# Patient Record
Sex: Female | Born: 1985 | Race: Black or African American | Hispanic: No | Marital: Single | Smoking: Never smoker
Health system: Southern US, Community
[De-identification: ages and names within clinical notes are randomized; demographics above are authoritative.]

## PROBLEM LIST (undated history)

## (undated) DIAGNOSIS — H409 Unspecified glaucoma: Secondary | ICD-10-CM

## (undated) DIAGNOSIS — G35 Multiple sclerosis: Secondary | ICD-10-CM

## (undated) DIAGNOSIS — R561 Post traumatic seizures: Secondary | ICD-10-CM

## (undated) DIAGNOSIS — R519 Headache, unspecified: Secondary | ICD-10-CM

## (undated) DIAGNOSIS — R51 Headache: Secondary | ICD-10-CM

## (undated) DIAGNOSIS — G709 Myoneural disorder, unspecified: Secondary | ICD-10-CM

## (undated) HISTORY — DX: Post traumatic seizures: R56.1

## (undated) HISTORY — PX: EYE SURGERY: SHX253

## (undated) HISTORY — PX: BRAIN SURGERY: SHX531

## (undated) HISTORY — DX: Unspecified glaucoma: H40.9

## (undated) HISTORY — DX: Myoneural disorder, unspecified: G70.9

---

## 2015-07-10 ENCOUNTER — Emergency Department (HOSPITAL_BASED_OUTPATIENT_CLINIC_OR_DEPARTMENT_OTHER): Admit: 2015-07-10 | Discharge: 2015-07-10 | Disposition: A | Discharge status: Home / Self Care | Payer: Self-pay

## 2015-07-10 ENCOUNTER — Encounter (HOSPITAL_COMMUNITY): Payer: Self-pay | Admitting: Emergency Medicine

## 2015-07-10 ENCOUNTER — Emergency Department (HOSPITAL_COMMUNITY)
Admission: EM | Admit: 2015-07-10 | Discharge: 2015-07-10 | Disposition: A | Discharge status: Home / Self Care | Payer: Self-pay | Attending: Emergency Medicine | Admitting: Emergency Medicine

## 2015-07-10 DIAGNOSIS — Y92039 Unspecified place in apartment as the place of occurrence of the external cause: Secondary | ICD-10-CM | POA: Insufficient documentation

## 2015-07-10 DIAGNOSIS — S8992XA Unspecified injury of left lower leg, initial encounter: Secondary | ICD-10-CM

## 2015-07-10 DIAGNOSIS — W19XXXA Unspecified fall, initial encounter: Secondary | ICD-10-CM | POA: Insufficient documentation

## 2015-07-10 DIAGNOSIS — S8012XA Contusion of left lower leg, initial encounter: Secondary | ICD-10-CM | POA: Insufficient documentation

## 2015-07-10 DIAGNOSIS — T148XXA Other injury of unspecified body region, initial encounter: Secondary | ICD-10-CM

## 2015-07-10 DIAGNOSIS — M7989 Other specified soft tissue disorders: Secondary | ICD-10-CM

## 2015-07-10 DIAGNOSIS — Y9301 Activity, walking, marching and hiking: Secondary | ICD-10-CM | POA: Insufficient documentation

## 2015-07-10 DIAGNOSIS — Y999 Unspecified external cause status: Secondary | ICD-10-CM | POA: Insufficient documentation

## 2015-07-10 MED ORDER — NAPROXEN 375 MG PO TABS: 375.0000 mg | ORAL_TABLET | Freq: Two times a day (BID) | ORAL | Status: DC

## 2015-07-10 NOTE — Progress Notes (Signed)
*  Preliminary Results* Left lower extremity venous duplex completed. Left lower extremity is negative for deep vein thrombosis. There is no evidence of left Baker's cyst.  07/10/2015 6:03 PM  Gertie Fey, RVT, RDCS, RDMS

## 2015-07-10 NOTE — ED Notes (Addendum)
Pt from home with complaints of swelling and pain on her left leg following a fall 7/1. Pt states she does not know how she fell, but she was walking in her apartment and fell landing on her left leg. Pt denies any other injuries from the fall. Pt states the pain feels like a burning pain and she rates it as a 5 when walking and a 6 when she is sitting. Pt ambulates without difficulty

## 2015-07-10 NOTE — Discharge Instructions (Signed)
Hematoma  A hematoma is a collection of blood under the skin, in an organ, in a body space, in a joint space, or in other tissue. The blood can clot to form a lump that you can see and feel. The lump is often firm and may sometimes become sore and tender. Most hematomas get better in a few days to weeks. However, some hematomas may be serious and require medical care. Hematomas can range in size from very small to very large.  CAUSES   A hematoma can be caused by a blunt or penetrating injury. It can also be caused by spontaneous leakage from a blood vessel under the skin. Spontaneous leakage from a blood vessel is more likely to occur in older people, especially those taking blood thinners. Sometimes, a hematoma can develop after certain medical procedures.  SIGNS AND SYMPTOMS   · A firm lump on the body.  · Possible pain and tenderness in the area.  · Bruising. Blue, dark blue, purple-red, or yellowish skin may appear at the site of the hematoma if the hematoma is close to the surface of the skin.  For hematomas in deeper tissues or body spaces, the signs and symptoms may be subtle. For example, an intra-abdominal hematoma may cause abdominal pain, weakness, fainting, and shortness of breath. An intracranial hematoma may cause a headache or symptoms such as weakness, trouble speaking, or a change in consciousness.  DIAGNOSIS   A hematoma can usually be diagnosed based on your medical history and a physical exam. Imaging tests may be needed if your health care provider suspects a hematoma in deeper tissues or body spaces, such as the abdomen, head, or chest. These tests may include ultrasonography or a CT scan.   TREATMENT   Hematomas usually go away on their own over time. Rarely does the blood need to be drained out of the body. Large hematomas or those that may affect vital organs will sometimes need surgical drainage or monitoring.  HOME CARE INSTRUCTIONS   · Apply ice to the injured area:      Put ice in a  plastic bag.      Place a towel between your skin and the bag.      Leave the ice on for 20 minutes, 2-3 times a day for the first 1 to 2 days.    · After the first 2 days, switch to using warm compresses on the hematoma.    · Elevate the injured area to help decrease pain and swelling. Wrapping the area with an elastic bandage may also be helpful. Compression helps to reduce swelling and promotes shrinking of the hematoma. Make sure the bandage is not wrapped too tight.    · If your hematoma is on a lower extremity and is painful, crutches may be helpful for a couple days.    · Only take over-the-counter or prescription medicines as directed by your health care provider.  SEEK IMMEDIATE MEDICAL CARE IF:   · You have increasing pain, or your pain is not controlled with medicine.    · You have a fever.    · You have worsening swelling or discoloration.    · Your skin over the hematoma breaks or starts bleeding.    · Your hematoma is in your chest or abdomen and you have weakness, shortness of breath, or a change in consciousness.  · Your hematoma is on your scalp (caused by a fall or injury) and you have a worsening headache or a change in alertness or consciousness.  MAKE SURE YOU:   ·   Understand these instructions.  · Will watch your condition.  · Will get help right away if you are not doing well or get worse.     This information is not intended to replace advice given to you by your health care provider. Make sure you discuss any questions you have with your health care provider.     Document Released: 08/03/2003 Document Revised: 08/21/2012 Document Reviewed: 05/29/2012  Elsevier Interactive Patient Education ©2016 [employer]

## 2015-07-10 NOTE — ED Provider Notes (Signed)
CSN: [id number]     Arrival date & time 07/10/15  1546 History  By signing my name below, I, [facility], attest that this documentation has been prepared under the direction and in the presence of Arthor Captain, PA-C. Electronically Signed: Randell Patient, ED Scribe. 07/10/2015. 6:09 PM.   Chief Complaint  Patient presents with  . Leg Pain    The history is provided by the patient. No language interpreter was used.   HPI Comments: Julia Taylor is a 30 y.o. female who presents to the Emergency Department complaining of constant, gradually worsening, moderate left leg pain onset 8 days ago. She describes the pain as dull while seated and sharp while standing. Pt states that she was ambulating in a parking lot when she fell to the ground followed by gradually worsening, moderate left leg swelling over the past week. She reports associated abrasions and bruising to the left shin. Denies any other symptoms currently.  History reviewed. No pertinent past medical history. Past Surgical History  Procedure Laterality Date  . Brain surgery     No family history on file. Social History  Substance Use Topics  . Smoking status: Never Smoker   . Smokeless tobacco: None  . Alcohol Use: No   OB History    No data available     Review of Systems  Musculoskeletal: Positive for myalgias (left leg).  Skin: Positive for color change (bruising) and wound (abrasions).  All other systems reviewed and are negative.     Allergies  Dilantin and Phenobarbital  Home Medications   Prior to Admission medications   Not on File   BP 130/85 mmHg  Pulse 95  Temp(Src) 99 F (37.2 C) (Oral)  Resp 14  Ht  (1.448 m)  Wt 220 lb (99.791 kg)  BMI 47.59 kg/m2  SpO2 99% Physical Exam  Constitutional: She is oriented to person, place, and time. She appears well-developed and well-nourished. No distress.  HENT:  Head: Normocephalic and atraumatic.  Eyes: Conjunctivae are normal.   Neck: Normal range of motion.  Cardiovascular: Normal rate.   Pulmonary/Chest: Effort normal. No respiratory distress.  Musculoskeletal: Normal range of motion. She exhibits edema and tenderness.  Small abrasion above left knee. Areas of bruising from the left shin. Significant swelling in left leg with tightness of the left calf and pedal edema. TTP. Palpable pulses in the left foot.  Neurological: She is alert and oriented to person, place, and time.  Skin: Skin is warm and dry. Abrasion and bruising noted.  Psychiatric: She has a normal mood and affect. Her behavior is normal.  Nursing note and vitals reviewed.   ED Course  Procedures   DIAGNOSTIC STUDIES: Oxygen Saturation is 99% on RA, normal by my interpretation.    COORDINATION OF CARE: 4:40 PM Will order [place] of lower left extremity to r/o DVT. Discussed treatment plan with pt at bedside and pt agreed to plan.  6:08 PM Returned to discuss results of vascular US with pt. Advised pt to rest, compress and apply ice to the leg. Will discharge pt.   MDM   Final diagnoses:  Left leg injury, initial encounter  Hematoma    Pt here complaining of lower left leg swelling with associated pain. Vascular US of lower left leg negative for signs of DVT. Will treat pt for hematoma of left lower leg. Advised rest, ice, and compression at home. Provided pt with referral to follow-up with orthopedist Dr. Shon Baton as needed. Normal vital signs. Return  precautions discussed.  I personally performed the services described in this documentation, which was scribed in my presence. The recorded information has been reviewed and is accurate.      Arthor Captain, PA-C 07/10/15 2122  Benjiman Core, MD 07/10/15 [phone]

## 2016-04-26 ENCOUNTER — Emergency Department (HOSPITAL_COMMUNITY): Payer: No Typology Code available for payment source

## 2016-04-26 ENCOUNTER — Emergency Department (HOSPITAL_COMMUNITY)
Admission: EM | Admit: 2016-04-26 | Discharge: 2016-04-26 | Disposition: A | Discharge status: Home / Self Care | Payer: No Typology Code available for payment source | Attending: Emergency Medicine | Admitting: Emergency Medicine

## 2016-04-26 ENCOUNTER — Encounter (HOSPITAL_COMMUNITY): Payer: Self-pay | Admitting: Emergency Medicine

## 2016-04-26 DIAGNOSIS — Y92410 Unspecified street and highway as the place of occurrence of the external cause: Secondary | ICD-10-CM | POA: Insufficient documentation

## 2016-04-26 DIAGNOSIS — Y939 Activity, unspecified: Secondary | ICD-10-CM | POA: Insufficient documentation

## 2016-04-26 DIAGNOSIS — Y999 Unspecified external cause status: Secondary | ICD-10-CM | POA: Insufficient documentation

## 2016-04-26 DIAGNOSIS — R0789 Other chest pain: Secondary | ICD-10-CM | POA: Diagnosis not present

## 2016-04-26 DIAGNOSIS — S299XXA Unspecified injury of thorax, initial encounter: Secondary | ICD-10-CM | POA: Diagnosis present

## 2016-04-26 MED ORDER — IBUPROFEN 200 MG PO TABS
600.0000 mg | ORAL_TABLET | Freq: Once | ORAL | Status: AC
  Administered 2016-04-26: 600 mg via ORAL
  Filled 2016-04-26: qty 1

## 2016-04-26 MED ORDER — CYCLOBENZAPRINE HCL 10 MG PO TABS: 10.0000 mg | ORAL_TABLET | Freq: Two times a day (BID) | ORAL | 0 refills | Status: DC | PRN

## 2016-04-26 NOTE — Discharge Instructions (Signed)
Please read attached information. If you experience any new or worsening signs or symptoms please return to the emergency room for evaluation. Please follow-up with your primary care provider or specialist as discussed. Please use medication prescribed only as directed and discontinue taking if you have any concerning signs or symptoms.   °
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
## 2017-02-13 ENCOUNTER — Ambulatory Visit: Payer: Self-pay | Admitting: Family Medicine

## 2017-02-15 ENCOUNTER — Encounter (HOSPITAL_COMMUNITY): Payer: Self-pay | Admitting: *Deleted

## 2017-02-15 ENCOUNTER — Inpatient Hospital Stay (HOSPITAL_COMMUNITY)
Admission: AD | Admit: 2017-02-15 | Discharge: 2017-02-15 | Disposition: A | Discharge status: Home / Self Care | Payer: Managed Care, Other (non HMO) | Source: Ambulatory Visit | Attending: Obstetrics and Gynecology | Admitting: Obstetrics and Gynecology

## 2017-02-15 ENCOUNTER — Other Ambulatory Visit: Payer: Self-pay

## 2017-02-15 DIAGNOSIS — R110 Nausea: Secondary | ICD-10-CM

## 2017-02-15 DIAGNOSIS — Z3202 Encounter for pregnancy test, result negative: Secondary | ICD-10-CM | POA: Diagnosis not present

## 2017-02-15 DIAGNOSIS — Z800 Family history of malignant neoplasm of digestive organs: Secondary | ICD-10-CM | POA: Diagnosis not present

## 2017-02-15 DIAGNOSIS — Z888 Allergy status to other drugs, medicaments and biological substances status: Secondary | ICD-10-CM | POA: Diagnosis not present

## 2017-02-15 DIAGNOSIS — R5383 Other fatigue: Secondary | ICD-10-CM | POA: Diagnosis not present

## 2017-02-15 HISTORY — DX: Headache: R51

## 2017-02-15 HISTORY — DX: Headache, unspecified: R51.9

## 2017-02-15 LAB — HCG, QUANTITATIVE, PREGNANCY: hCG, Beta Chain, Quant, S: 1 m[IU]/mL (ref <5)

## 2017-02-15 LAB — POCT PREGNANCY, URINE: Preg Test, Ur: NEGATIVE

## 2017-02-15 LAB — ABO/RH: ABO/RH(D): B POS

## 2017-02-15 NOTE — MAU Note (Signed)
Went to Urgent Care last wk, was told by blood work that she was preg. (3-4wks), went back on Tues to same facility and was told by blood work she is not preg.  Has had NO bleeding or pain. No sign of miscarriage. Has had some minor cramping, though none today.  Has not missed a period, was having nausea, cramping and sleeping all the time.
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
## 2017-02-15 NOTE — MAU Note (Signed)
Urine in lab with culture tube

## 2017-02-15 NOTE — Discharge Instructions (Signed)
Drink at least 8 8-oz glasses of water every day. Take Tylenol 325 mg 2 tablets by mouth every 4 hours if needed for pain. Continue the prenatal vitamin daily. Establish with a provider who can follow you through a pregnancy. Keep a diary listing the first day of your periods.

## 2017-03-20 ENCOUNTER — Other Ambulatory Visit (HOSPITAL_COMMUNITY)
Admission: RE | Admit: 2017-03-20 | Discharge: 2017-03-20 | Disposition: A | Discharge status: Home / Self Care | Payer: Managed Care, Other (non HMO) | Source: Ambulatory Visit | Attending: Family Medicine | Admitting: Family Medicine

## 2017-03-20 ENCOUNTER — Other Ambulatory Visit: Payer: Self-pay

## 2017-03-20 ENCOUNTER — Ambulatory Visit: Payer: Managed Care, Other (non HMO) | Admitting: Family Medicine

## 2017-03-20 ENCOUNTER — Ambulatory Visit (INDEPENDENT_AMBULATORY_CARE_PROVIDER_SITE_OTHER): Payer: Managed Care, Other (non HMO) | Admitting: Family Medicine

## 2017-03-20 ENCOUNTER — Encounter: Payer: Self-pay | Admitting: Family Medicine

## 2017-03-20 VITALS — BP 126/74 | HR 81 | Temp 98.0°F | Ht <= 58 in | Wt 225.0 lb

## 2017-03-20 DIAGNOSIS — Z6841 Body Mass Index (BMI) 40.0 and over, adult: Secondary | ICD-10-CM | POA: Insufficient documentation

## 2017-03-20 DIAGNOSIS — Z113 Encounter for screening for infections with a predominantly sexual mode of transmission: Secondary | ICD-10-CM | POA: Diagnosis not present

## 2017-03-20 DIAGNOSIS — Z8742 Personal history of other diseases of the female genital tract: Secondary | ICD-10-CM | POA: Diagnosis not present

## 2017-03-20 DIAGNOSIS — E559 Vitamin D deficiency, unspecified: Secondary | ICD-10-CM | POA: Insufficient documentation

## 2017-03-20 DIAGNOSIS — Z114 Encounter for screening for human immunodeficiency virus [HIV]: Secondary | ICD-10-CM | POA: Diagnosis not present

## 2017-03-20 DIAGNOSIS — Z0000 Encounter for general adult medical examination without abnormal findings: Secondary | ICD-10-CM | POA: Insufficient documentation

## 2017-03-20 LAB — POCT WET PREP (WET MOUNT)
Clue Cells Wet Prep Whiff POC: NEGATIVE
TRICHOMONAS WET PREP HPF POC: ABSENT

## 2017-03-20 NOTE — Patient Instructions (Addendum)
It was good to see you today!  We are checking some labs today. If results require attention, either myself or my nurse will get in touch with you. If everything is normal, you will get a letter in the mail or a message in My Chart. Please give [place] a call if you do not hear from [place] after 2 weeks.  Please check-out at the front desk before leaving the clinic. Make an appointment for your annual wellness exam whenever you like.   Please bring all of your medications with you to each visit.   Sign up for My Chart to have easy access to your labs results, and communication with your primary care physician.  Feel free to call with any questions or concerns at any time, at [phone].   Take care,  Dr. Leland Her, DO [facility]
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
## 2017-03-20 NOTE — Assessment & Plan Note (Signed)
Given recent treatment for trich but with untreated partner, re-testing today. Wet prep negative. GC/CT and HIV pending.

## 2017-03-20 NOTE — Assessment & Plan Note (Signed)
Patient finishing up course of vitamin D replacement so recheck vit D levels.

## 2017-03-20 NOTE — Assessment & Plan Note (Signed)
Patient missed period in February after a positive home pregnancy test in January but a negative in-office pregnancy test at [facility]. Question if patient had a very early miscarriage or is was a false positive. She does endorse h/o heavy periods so will get baseline CBC.

## 2017-03-20 NOTE — Assessment & Plan Note (Addendum)
Check lipid panel and BMP given patient is in morbid obesity range. Discussed that at future visit will need to talk about lifestyle modifications.

## 2017-03-21 LAB — LIPID PANEL
CHOL/HDL RATIO: 4.6 ratio — AB (ref 0.0–4.4)
Cholesterol, Total: 223 mg/dL — ABNORMAL HIGH (ref 100–199)
HDL: 48 mg/dL (ref >39)
LDL Calculated: 154 mg/dL — ABNORMAL HIGH (ref 0–99)
TRIGLYCERIDES: 106 mg/dL (ref 0–149)
VLDL Cholesterol Cal: 21 mg/dL (ref 5–40)

## 2017-03-21 LAB — BASIC METABOLIC PANEL
BUN / CREAT RATIO: 17 (ref 9–23)
BUN: 9 mg/dL (ref 6–20)
CO2: 19 mmol/L — AB (ref 20–29)
Calcium: 10.6 mg/dL — ABNORMAL HIGH (ref 8.7–10.2)
Chloride: 104 mmol/L (ref 96–106)
Creatinine, Ser: 0.53 mg/dL — ABNORMAL LOW (ref 0.57–1.00)
GFR calc Af Amer: 148 mL/min/{1.73_m2} (ref >59)
GFR, EST NON AFRICAN AMERICAN: 129 mL/min/{1.73_m2} (ref >59)
Glucose: 74 mg/dL (ref 65–99)
POTASSIUM: 4.4 mmol/L (ref 3.5–5.2)
SODIUM: 139 mmol/L (ref 134–144)

## 2017-03-21 LAB — CBC
HEMATOCRIT: 42.8 % (ref 34.0–46.6)
Hemoglobin: 14.3 g/dL (ref 11.1–15.9)
MCH: 29.7 pg (ref 26.6–33.0)
MCHC: 33.4 g/dL (ref 31.5–35.7)
MCV: 89 fL (ref 79–97)
Platelets: 295 10*3/uL (ref 150–379)
RBC: 4.81 x10E6/uL (ref 3.77–5.28)
RDW: 14 % (ref 12.3–15.4)
WBC: 5.8 10*3/uL (ref 3.4–10.8)

## 2017-03-21 LAB — CERVICOVAGINAL ANCILLARY ONLY
CHLAMYDIA, DNA PROBE: NEGATIVE
NEISSERIA GONORRHEA: NEGATIVE

## 2017-03-21 LAB — VITAMIN D 25 HYDROXY (VIT D DEFICIENCY, FRACTURES): VIT D 25 HYDROXY: 22.5 ng/mL — AB (ref 30.0–100.0)

## 2017-03-21 LAB — HIV ANTIBODY (ROUTINE TESTING W REFLEX): HIV SCREEN 4TH GENERATION: NONREACTIVE

## 2017-03-23 ENCOUNTER — Telehealth: Payer: Self-pay

## 2017-03-23 ENCOUNTER — Encounter: Payer: Self-pay | Admitting: Family Medicine

## 2017-03-23 NOTE — Telephone Encounter (Signed)
Called patient and discussed getting OTC vit D 600-800 IU daily. All questions were answered.

## 2017-03-23 NOTE — Telephone Encounter (Signed)
Patient would like a call back to discuss her lab results with Dr Artist Pais. Ples Specter, RN [facility] RN)

## 2017-06-03 ENCOUNTER — Emergency Department (HOSPITAL_COMMUNITY)
Admission: EM | Admit: 2017-06-03 | Discharge: 2017-06-03 | Disposition: A | Discharge status: Home / Self Care | Payer: Managed Care, Other (non HMO) | Attending: Emergency Medicine | Admitting: Emergency Medicine

## 2017-06-03 ENCOUNTER — Emergency Department (HOSPITAL_COMMUNITY): Payer: Managed Care, Other (non HMO)

## 2017-06-03 ENCOUNTER — Other Ambulatory Visit: Payer: Self-pay

## 2017-06-03 DIAGNOSIS — Z79899 Other long term (current) drug therapy: Secondary | ICD-10-CM | POA: Diagnosis not present

## 2017-06-03 DIAGNOSIS — R1032 Left lower quadrant pain: Secondary | ICD-10-CM | POA: Insufficient documentation

## 2017-06-03 DIAGNOSIS — R109 Unspecified abdominal pain: Secondary | ICD-10-CM

## 2017-06-03 DIAGNOSIS — N926 Irregular menstruation, unspecified: Secondary | ICD-10-CM | POA: Diagnosis not present

## 2017-06-03 LAB — CBC WITH DIFFERENTIAL/PLATELET
BASOS ABS: 0 10*3/uL (ref 0.0–0.1)
BASOS PCT: 0 %
Eosinophils Absolute: 0.1 10*3/uL (ref 0.0–0.7)
Eosinophils Relative: 2 %
HEMATOCRIT: 40.9 % (ref 36.0–46.0)
HEMOGLOBIN: 14.1 g/dL (ref 12.0–15.0)
LYMPHS PCT: 35 %
Lymphs Abs: 2.4 10*3/uL (ref 0.7–4.0)
MCH: 29.9 pg (ref 26.0–34.0)
MCHC: 34.5 g/dL (ref 30.0–36.0)
MCV: 86.8 fL (ref 78.0–100.0)
Monocytes Absolute: 0.5 10*3/uL (ref 0.1–1.0)
Monocytes Relative: 7 %
NEUTROS ABS: 3.9 10*3/uL (ref 1.7–7.7)
NEUTROS PCT: 56 %
Platelets: 296 10*3/uL (ref 150–400)
RBC: 4.71 MIL/uL (ref 3.87–5.11)
RDW: 12.6 % (ref 11.5–15.5)
WBC: 6.9 10*3/uL (ref 4.0–10.5)

## 2017-06-03 LAB — URINALYSIS, ROUTINE W REFLEX MICROSCOPIC
Bilirubin Urine: NEGATIVE
GLUCOSE, UA: NEGATIVE mg/dL
HGB URINE DIPSTICK: NEGATIVE
KETONES UR: 5 mg/dL — AB
LEUKOCYTES UA: NEGATIVE
Nitrite: NEGATIVE
PH: 5 (ref 5.0–8.0)
Protein, ur: NEGATIVE mg/dL
Specific Gravity, Urine: 1.03 (ref 1.005–1.030)

## 2017-06-03 LAB — COMPREHENSIVE METABOLIC PANEL
ALBUMIN: 3.9 g/dL (ref 3.5–5.0)
ALK PHOS: 55 U/L (ref 38–126)
ALT: 24 U/L (ref 14–54)
AST: 18 U/L (ref 15–41)
Anion gap: 8 (ref 5–15)
BUN: 9 mg/dL (ref 6–20)
CO2: 23 mmol/L (ref 22–32)
CREATININE: 0.71 mg/dL (ref 0.44–1.00)
Calcium: 10.6 mg/dL — ABNORMAL HIGH (ref 8.9–10.3)
Chloride: 110 mmol/L (ref 101–111)
GFR calc Af Amer: >60 mL/min (ref >60)
GFR calc non Af Amer: >60 mL/min (ref >60)
GLUCOSE: 95 mg/dL (ref 65–99)
POTASSIUM: 3.6 mmol/L (ref 3.5–5.1)
Sodium: 141 mmol/L (ref 135–145)
Total Bilirubin: 0.5 mg/dL (ref 0.3–1.2)
Total Protein: 7.4 g/dL (ref 6.5–8.1)

## 2017-06-03 LAB — POC URINE PREG, ED: Preg Test, Ur: NEGATIVE

## 2017-06-03 LAB — WET PREP, GENITAL
CLUE CELLS WET PREP: NONE SEEN
SPERM: NONE SEEN
TRICH WET PREP: NONE SEEN
Yeast Wet Prep HPF POC: NONE SEEN

## 2017-06-03 LAB — LIPASE, BLOOD: Lipase: 34 U/L (ref 11–51)

## 2017-06-03 MED ORDER — IBUPROFEN 800 MG PO TABS
800.0000 mg | ORAL_TABLET | Freq: Once | ORAL | Status: AC
  Administered 2017-06-03: 800 mg via ORAL
  Filled 2017-06-03: qty 1

## 2017-06-03 MED ORDER — SODIUM CHLORIDE 0.9 % IV BOLUS
1000.0000 mL | Freq: Once | INTRAVENOUS | Status: AC
  Administered 2017-06-03: 1000 mL via INTRAVENOUS

## 2017-06-03 NOTE — ED Notes (Signed)
Urine culture sent down to the lab as well. 
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
## 2017-06-03 NOTE — ED Triage Notes (Signed)
Pt complains of left flank pain. Pt was told she had protein in her urine last week and is concerned about her kidneys. Pt denies urinary symptoms.

## 2017-06-03 NOTE — Discharge Instructions (Addendum)
Your work up today has been fairly unremarkable, there was no evidence of kidney stone, and your pelvic exam was unremarkable. Your flank and abdominal pain could be from a variety of different nonemergent causes, such as constipation or gas pain, pulled muscle, etc. Alternate between tylenol and ibuprofen as needed for pain. You may consider using heat to the area to help with pain as well. Stay well hydrated and get plenty of rest.  You have been tested for gonorrhea, chlamydia, HIV, and Syphilis, and the hospital will call you if the lab is positive. DO NOT ENGAGE IN SEXUAL ACTIVITY UNTIL YOU FIND OUT ABOUT YOUR RESULTS AND HAVE PARTNERS TESTED AND TREATED. ALL PARTNERS MUST BE TESTED AND TREATED FOR STD'S. ALWAYS USE CONDOMS WHEN ENGAGING IN INTERCOURSE. Follow up with [facility] Department STD clinic for future STD concerns or screenings.  Follow up with your regular doctor in 1 week for recheck of symptoms. Return to the ER for any changes or worsening symptoms.     Abdominal (belly) pain can be caused by many things. Your caregiver performed an examination and possibly ordered blood/urine tests and imaging (CT scan, x-rays, ultrasound). Many cases can be observed and treated at home after initial evaluation in the emergency department. Even though you are being discharged home, abdominal pain can be unpredictable. Therefore, you need a repeated exam if your pain does not resolve, returns, or worsens. Most patients with abdominal pain don't have to be admitted to the hospital or have surgery, but serious problems like appendicitis and gallbladder attacks can start out as nonspecific pain. Many abdominal conditions cannot be diagnosed in one visit, so follow-up evaluations are very important. SEEK IMMEDIATE MEDICAL ATTENTION IF YOU DEVELOP ANY OF THE FOLLOWING SYMPTOMS: The pain does not go away or becomes severe.  A temperature above 101 develops.  Repeated vomiting occurs (multiple  episodes).  The pain becomes localized to portions of the abdomen. The right side could possibly be appendicitis. In an adult, the left lower portion of the abdomen could be colitis or diverticulitis.  Blood is being passed in stools or vomit (bright red or black tarry stools).  Return also if you develop chest pain, difficulty breathing, dizziness or fainting, or become confused, poorly responsive, or inconsolable (young children). The constipation stays for more than 4 days.  There is belly (abdominal) or rectal pain.  You do not seem to be getting better.

## 2017-06-04 ENCOUNTER — Ambulatory Visit: Payer: Managed Care, Other (non HMO) | Admitting: Internal Medicine

## 2017-06-05 LAB — HIV ANTIBODY (ROUTINE TESTING W REFLEX): HIV Screen 4th Generation wRfx: NONREACTIVE

## 2017-06-05 LAB — RPR: RPR: NONREACTIVE

## 2017-12-04 DIAGNOSIS — Z719 Counseling, unspecified: Secondary | ICD-10-CM | POA: Diagnosis not present

## 2017-12-05 ENCOUNTER — Encounter: Payer: Self-pay | Admitting: Family Medicine

## 2017-12-05 ENCOUNTER — Other Ambulatory Visit (HOSPITAL_COMMUNITY)
Admission: RE | Admit: 2017-12-05 | Discharge: 2017-12-05 | Disposition: A | Discharge status: Home / Self Care | Payer: 59 | Source: Ambulatory Visit | Attending: Family Medicine | Admitting: Family Medicine

## 2017-12-05 ENCOUNTER — Ambulatory Visit (INDEPENDENT_AMBULATORY_CARE_PROVIDER_SITE_OTHER): Payer: 59 | Admitting: Family Medicine

## 2017-12-05 ENCOUNTER — Other Ambulatory Visit: Payer: Self-pay

## 2017-12-05 VITALS — BP 104/70 | HR 86 | Temp 98.3°F | Ht <= 58 in | Wt 218.2 lb

## 2017-12-05 DIAGNOSIS — Z124 Encounter for screening for malignant neoplasm of cervix: Secondary | ICD-10-CM | POA: Insufficient documentation

## 2017-12-05 DIAGNOSIS — E559 Vitamin D deficiency, unspecified: Secondary | ICD-10-CM

## 2017-12-05 DIAGNOSIS — Z0001 Encounter for general adult medical examination with abnormal findings: Secondary | ICD-10-CM

## 2017-12-05 DIAGNOSIS — Z23 Encounter for immunization: Secondary | ICD-10-CM | POA: Diagnosis not present

## 2017-12-05 DIAGNOSIS — Z719 Counseling, unspecified: Secondary | ICD-10-CM | POA: Diagnosis not present

## 2017-12-05 DIAGNOSIS — Z6841 Body Mass Index (BMI) 40.0 and over, adult: Secondary | ICD-10-CM

## 2017-12-05 NOTE — Patient Instructions (Signed)
Things to do to keep yourself healthy  - Exercise at least 30-45 minutes a day, 3-4 days a week.  - Eat a low-fat diet with lots of fruits and vegetables, up to 7-9 servings per day.  - Seatbelts can save your life. Wear them always.  - Smoke detectors on every level of your home, check batteries every year.  - Eye Doctor - have an eye exam every 1-2 years  - Safe sex - if you may be exposed to STDs, use a condom.  - Alcohol -  If you drink, do it moderately, less than 2 drinks per day.  - Health Care Power of Attorney. Choose someone to speak for you if you are not able.  - Depression is common in our stressful world.If you're feeling down or losing interest in things you normally enjoy, please come in for a visit.  - Violence - If anyone is threatening or hurting you, please call immediately.  It was good to see you today!  Please check-out at the front desk before leaving the clinic. Make an appointment in  1 year.  We are checking some labs today. If results require attention, either myself or my nurse will get in touch with you. If everything is normal, you will get a letter in the mail or a message in My Chart. Please give [place] a call if you do not hear from [place] after 2 weeks.   Feel free to call with any questions or concerns at any time, at [phone].   Take care,  Dr. Leland Her, DO [facility]

## 2017-12-05 NOTE — Assessment & Plan Note (Signed)
Pap collected today.  Flu shot given today. Otherwise UTD.

## 2017-12-05 NOTE — Assessment & Plan Note (Signed)
Recheck vitamin D today as patient has self discontinued vitamin D supplementation

## 2017-12-05 NOTE — Assessment & Plan Note (Signed)
Doing well with some intentional weight loss after lifestyle modifications. Encouraged patient efforts and counseled on healthy diet.
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
## 2017-12-06 ENCOUNTER — Encounter: Payer: Self-pay | Admitting: Family Medicine

## 2017-12-06 LAB — VITAMIN D 25 HYDROXY (VIT D DEFICIENCY, FRACTURES): VIT D 25 HYDROXY: 29.9 ng/mL — AB (ref 30.0–100.0)

## 2017-12-07 LAB — CYTOLOGY - PAP
DIAGNOSIS: UNDETERMINED — AB
HPV (WINDOPATH): NOT DETECTED

## 2017-12-10 ENCOUNTER — Telehealth: Payer: Self-pay | Admitting: Family Medicine

## 2017-12-10 DIAGNOSIS — A5901 Trichomonal vulvovaginitis: Secondary | ICD-10-CM

## 2017-12-10 MED ORDER — METRONIDAZOLE 500 MG PO TABS: 2000.0000 mg | ORAL_TABLET | Freq: Once | ORAL | 0 refills | Status: AC

## 2017-12-10 NOTE — Telephone Encounter (Signed)
Discussed treatment for patient and all sexual partners for trich. Next pap in 3 years because HPV neg. Patient voiced good understanding. Requested to see me for numbness in her fingers which a new complaint, no cyanosis or weakness. Does have lots of repetitive motion at work. Appt made with me on 12/14/17

## 2017-12-11 ENCOUNTER — Emergency Department (HOSPITAL_COMMUNITY)
Admission: EM | Admit: 2017-12-11 | Discharge: 2017-12-11 | Disposition: A | Discharge status: Home / Self Care | Payer: 59 | Attending: Emergency Medicine | Admitting: Emergency Medicine

## 2017-12-11 ENCOUNTER — Encounter (HOSPITAL_COMMUNITY): Payer: Self-pay | Admitting: *Deleted

## 2017-12-11 DIAGNOSIS — Z79899 Other long term (current) drug therapy: Secondary | ICD-10-CM | POA: Diagnosis not present

## 2017-12-11 DIAGNOSIS — G5603 Carpal tunnel syndrome, bilateral upper limbs: Secondary | ICD-10-CM | POA: Insufficient documentation

## 2017-12-11 DIAGNOSIS — R200 Anesthesia of skin: Secondary | ICD-10-CM | POA: Diagnosis not present

## 2017-12-11 LAB — CBG MONITORING, ED: GLUCOSE-CAPILLARY: 71 mg/dL (ref 70–99)

## 2017-12-11 MED ORDER — MELOXICAM 15 MG PO TABS: 15.0000 mg | ORAL_TABLET | Freq: Every day | ORAL | 0 refills | Status: DC

## 2017-12-11 NOTE — ED Triage Notes (Signed)
Pt complains of bilateral finger numbness and tingling x 4 days. Pt denies other symptoms. 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

## 2017-12-11 NOTE — ED Notes (Signed)
CBG 71.  

## 2017-12-11 NOTE — Discharge Instructions (Addendum)
Your blood sugar is normal. Contact a health care provider if: You have new symptoms. Your pain is not controlled with medicines. Your symptoms get worse.
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
## 2017-12-14 ENCOUNTER — Ambulatory Visit: Payer: 59 | Admitting: Family Medicine

## 2018-02-14 ENCOUNTER — Encounter (HOSPITAL_COMMUNITY): Payer: Self-pay | Admitting: Emergency Medicine

## 2018-02-14 DIAGNOSIS — Z79899 Other long term (current) drug therapy: Secondary | ICD-10-CM | POA: Insufficient documentation

## 2018-02-14 DIAGNOSIS — G5603 Carpal tunnel syndrome, bilateral upper limbs: Secondary | ICD-10-CM | POA: Insufficient documentation

## 2018-02-14 DIAGNOSIS — R202 Paresthesia of skin: Secondary | ICD-10-CM | POA: Diagnosis not present

## 2018-02-14 LAB — COMPREHENSIVE METABOLIC PANEL
ALBUMIN: 4.1 g/dL (ref 3.5–5.0)
ALT: 23 U/L (ref 0–44)
AST: 20 U/L (ref 15–41)
Alkaline Phosphatase: 57 U/L (ref 38–126)
Anion gap: 7 (ref 5–15)
BILIRUBIN TOTAL: 0.6 mg/dL (ref 0.3–1.2)
BUN: 11 mg/dL (ref 6–20)
CALCIUM: 10.2 mg/dL (ref 8.9–10.3)
CO2: 25 mmol/L (ref 22–32)
Chloride: 104 mmol/L (ref 98–111)
Creatinine, Ser: 0.68 mg/dL (ref 0.44–1.00)
GFR calc Af Amer: >60 mL/min (ref >60)
GFR calc non Af Amer: >60 mL/min (ref >60)
GLUCOSE: 84 mg/dL (ref 70–99)
POTASSIUM: 4 mmol/L (ref 3.5–5.1)
Sodium: 136 mmol/L (ref 135–145)
TOTAL PROTEIN: 8 g/dL (ref 6.5–8.1)

## 2018-02-14 LAB — CBC WITH DIFFERENTIAL/PLATELET
ABS IMMATURE GRANULOCYTES: 0.04 10*3/uL (ref 0.00–0.07)
Basophils Absolute: 0 10*3/uL (ref 0.0–0.1)
Basophils Relative: 0 %
EOS ABS: 0.2 10*3/uL (ref 0.0–0.5)
Eosinophils Relative: 2 %
HEMATOCRIT: 45.4 % (ref 36.0–46.0)
Hemoglobin: 15.2 g/dL — ABNORMAL HIGH (ref 12.0–15.0)
Immature Granulocytes: 0 %
LYMPHS ABS: 1.9 10*3/uL (ref 0.7–4.0)
Lymphocytes Relative: 22 %
MCH: 30 pg (ref 26.0–34.0)
MCHC: 33.5 g/dL (ref 30.0–36.0)
MCV: 89.5 fL (ref 80.0–100.0)
MONO ABS: 0.4 10*3/uL (ref 0.1–1.0)
MONOS PCT: 5 %
NEUTROS ABS: 6.4 10*3/uL (ref 1.7–7.7)
Neutrophils Relative %: 71 %
Platelets: 300 10*3/uL (ref 150–400)
RBC: 5.07 MIL/uL (ref 3.87–5.11)
RDW: 12.2 % (ref 11.5–15.5)
WBC: 9 10*3/uL (ref 4.0–10.5)
nRBC: 0 % (ref 0.0–0.2)

## 2018-02-14 LAB — I-STAT BETA HCG BLOOD, ED (MC, WL, AP ONLY): I-stat hCG, quantitative: <5 m[IU]/mL (ref <5)

## 2018-02-14 LAB — LACTIC ACID, PLASMA: Lactic Acid, Venous: 1.3 mmol/L (ref 0.5–1.9)

## 2018-02-14 NOTE — ED Triage Notes (Signed)
Patient here from home with complaints of bilateral hand pain radiating up arms into head. Described as tingling and pain.

## 2018-02-15 ENCOUNTER — Emergency Department (HOSPITAL_COMMUNITY)
Admission: EM | Admit: 2018-02-15 | Discharge: 2018-02-15 | Disposition: A | Discharge status: Home / Self Care | Payer: 59 | Attending: Emergency Medicine | Admitting: Emergency Medicine

## 2018-02-15 ENCOUNTER — Other Ambulatory Visit: Payer: Self-pay

## 2018-02-15 ENCOUNTER — Encounter: Payer: Self-pay | Admitting: Neurology

## 2018-02-15 DIAGNOSIS — G5603 Carpal tunnel syndrome, bilateral upper limbs: Secondary | ICD-10-CM

## 2018-02-15 DIAGNOSIS — M542 Cervicalgia: Secondary | ICD-10-CM | POA: Diagnosis not present

## 2018-02-15 DIAGNOSIS — R202 Paresthesia of skin: Secondary | ICD-10-CM

## 2018-02-15 MED ORDER — NAPROXEN 500 MG PO TABS: 500.0000 mg | ORAL_TABLET | Freq: Two times a day (BID) | ORAL | 0 refills | Status: DC

## 2018-02-15 NOTE — ED Notes (Signed)
Bed: YY51 Expected date:  Expected time:  Means of arrival:  Comments: Joseph Art
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
## 2018-02-16 ENCOUNTER — Encounter: Payer: Self-pay | Admitting: Family Medicine

## 2018-02-18 ENCOUNTER — Encounter: Payer: Self-pay | Admitting: Neurology

## 2018-02-18 ENCOUNTER — Ambulatory Visit: Payer: 59 | Admitting: Neurology

## 2018-02-18 VITALS — BP 136/74 | HR 76 | Ht 60.0 in | Wt 215.0 lb

## 2018-02-18 DIAGNOSIS — Z87828 Personal history of other (healed) physical injury and trauma: Secondary | ICD-10-CM | POA: Diagnosis not present

## 2018-02-18 DIAGNOSIS — R202 Paresthesia of skin: Secondary | ICD-10-CM | POA: Insufficient documentation

## 2018-02-18 MED ORDER — LEVETIRACETAM 500 MG PO TABS: 500.0000 mg | ORAL_TABLET | Freq: Two times a day (BID) | ORAL | 11 refills | Status: DC
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
## 2018-02-20 ENCOUNTER — Telehealth: Payer: Self-pay | Admitting: Neurology

## 2018-02-20 NOTE — Telephone Encounter (Signed)
MR Brain w/wo contrast Dr. Terrace Arabia [facility] Auth: NPR via [employer] website. Patient is scheduled at [facility] for 02/26/18.

## 2018-02-22 ENCOUNTER — Encounter: Payer: 59 | Admitting: Neurology

## 2018-02-26 ENCOUNTER — Ambulatory Visit: Payer: 59

## 2018-02-26 DIAGNOSIS — R202 Paresthesia of skin: Secondary | ICD-10-CM

## 2018-02-26 DIAGNOSIS — Z87828 Personal history of other (healed) physical injury and trauma: Secondary | ICD-10-CM

## 2018-02-26 MED ORDER — GADOBENATE DIMEGLUMINE 529 MG/ML IV SOLN
20.0000 mL | Freq: Once | INTRAVENOUS | Status: AC | PRN
  Administered 2018-02-26: 20 mL via INTRAVENOUS

## 2018-02-26 MED ORDER — GADOBENATE DIMEGLUMINE 529 MG/ML IV SOLN: 20.0000 mL | Freq: Once | INTRAVENOUS | Status: DC | PRN

## 2018-02-28 ENCOUNTER — Telehealth: Payer: Self-pay | Admitting: Neurology

## 2018-02-28 NOTE — Telephone Encounter (Signed)
please call patient, MRI of brain showed mild supratentorium nonspecific signal changes, no acute abnormalities.   IMPRESSION:   MRI brain (with and without) demonstrating: - Few round and ovoid periventricular, subcortical and right cerebellar T2 hyperintensities. Considerations include demyelinating, autoimmune, inflammatory, post-infectious, microvascular ischemic or migraine associated etiologies.  - No acute changes. No abnormal enhancing lesions.

## 2018-02-28 NOTE — Telephone Encounter (Signed)
Spoke to patient - she is aware of her MRI results and will keep her pending appt for further review.

## 2018-02-28 NOTE — Telephone Encounter (Signed)
Left message requesting a return call to discuss MRI results.

## 2018-03-06 ENCOUNTER — Telehealth: Payer: Self-pay | Admitting: Neurology

## 2018-03-06 DIAGNOSIS — R9089 Other abnormal findings on diagnostic imaging of central nervous system: Secondary | ICD-10-CM

## 2018-03-06 MED ORDER — LEVETIRACETAM 500 MG PO TABS: ORAL_TABLET | ORAL | 0 refills | Status: DC

## 2018-03-06 NOTE — Telephone Encounter (Signed)
  To: [facility] Clinical Pool  Subject: Non-Urgent Medical Question             Okay thank you. Since Feb 17 I have had three attacks one feb 22 in my right arm, feb 26 and 27 in my left arm I understand the MRI was good and we have these other test but I can still feel the pain to the point it's not locking up (which is good) now but I still can feel the electricity feeling going through my hands and face (a very uncomfortable feeling) I don't know if my next three test can be moved up or not. But is there anything your able to advice that I can do right now to easy the discomfort   Please check with patient to see if starting keppra 500mg  bid was helpful or not.  We have room to increase keppra to higher dose if it does help

## 2018-03-06 NOTE — Telephone Encounter (Addendum)
Returned call to the patient.  She is not having the "locking up" of her arm any longer but is still having the electrical sensations.  She feels this improvement is related to Keppra 500mg , one tablet BID.  Per vo by Dr. Terrace Arabia, she should increase her Keppra to 500mg , 1.5 tablets BID to see if this is helpful for her remaining symptoms.  She is in agreement with his plan. She will keep her pending NCV/EMG on 03/27/2018.

## 2018-03-06 NOTE — Addendum Note (Signed)
Addended by: Lindell Spar C on: 03/06/2018 04:09 PM   Modules accepted: Orders

## 2018-03-06 NOTE — Telephone Encounter (Signed)
Pt is asking to speak with RN Marcelino Duster because she just had a seizure and she does not feel her current medication is helping.  Please call

## 2018-03-07 MED ORDER — GABAPENTIN 300 MG PO CAPS: 300.0000 mg | ORAL_CAPSULE | Freq: Three times a day (TID) | ORAL | 3 refills | Status: DC

## 2018-03-07 NOTE — Telephone Encounter (Signed)
I have talked with patient, she denies improvement with Keppra 500 mg twice daily, she now has more frequent spells, once a week, she complains of muscle tightness from breast and up radiating to bilateral upper extremity, muscle contraction relaxing for 1 hour, there was no persistent weakness, or numbness, no gait abnormality  I also reviewed MRI of the brain without contrast, there was some supratentorium, right cerebellum oval-shaped lesion, which raised the possibility of demyelinating,  Complete evaluation with MRI of cervical, thoracic spine  Stop Keppra, starting  Gabapentin 300mg  tid.

## 2018-03-07 NOTE — Addendum Note (Signed)
Addended by: Levert Feinstein on: 03/07/2018 09:51 AM   Modules accepted: Orders

## 2018-03-11 ENCOUNTER — Telehealth: Payer: Self-pay | Admitting: Neurology

## 2018-03-11 NOTE — Telephone Encounter (Signed)
Patient returned my call she is scheduled for 03/13/18 at [facility].

## 2018-03-11 NOTE — Telephone Encounter (Signed)
lvm for pt to call back about scheduling MRI  [employer] auth: NPR via [employer]

## 2018-03-13 ENCOUNTER — Other Ambulatory Visit: Payer: Self-pay

## 2018-03-13 ENCOUNTER — Ambulatory Visit (INDEPENDENT_AMBULATORY_CARE_PROVIDER_SITE_OTHER): Payer: 59

## 2018-03-13 DIAGNOSIS — R9089 Other abnormal findings on diagnostic imaging of central nervous system: Secondary | ICD-10-CM | POA: Diagnosis not present

## 2018-03-13 MED ORDER — GADOBENATE DIMEGLUMINE 529 MG/ML IV SOLN
20.0000 mL | Freq: Once | INTRAVENOUS | Status: AC | PRN
  Administered 2018-03-13: 20 mL via INTRAVENOUS

## 2018-03-15 ENCOUNTER — Telehealth: Payer: Self-pay | Admitting: *Deleted

## 2018-03-15 DIAGNOSIS — R930 Abnormal findings on diagnostic imaging of skull and head, not elsewhere classified: Secondary | ICD-10-CM

## 2018-03-15 NOTE — Telephone Encounter (Signed)
Spoke with pt. and advised MRI T-spine is nml.  She verbalized understanding of same/fim

## 2018-03-15 NOTE — Telephone Encounter (Signed)
-----   Message from Suanne Marker, MD sent at 03/15/2018 11:05 AM EDT ----- Abnormal spinal cord lesions raise possibility of multiple sclerosis. Recommend to discuss with Dr. Terrace Arabia on Monday. No acute lesions. -VRP

## 2018-03-15 NOTE — Telephone Encounter (Signed)
-----   Message from Suanne Marker, MD sent at 03/15/2018 11:06 AM EDT ----- Unremarkable thoracic spine results. -VRP

## 2018-03-15 NOTE — Telephone Encounter (Signed)
Spoke with pt. and reviewed below MRI results--abnormal lesions that could represent MS. Dr. Terrace Arabia will review and advise further next wk. Pt. verbalized understanding of same/fim

## 2018-03-18 NOTE — Addendum Note (Signed)
Addended by: Levert Feinstein on: 03/18/2018 11:43 AM   Modules accepted: Orders

## 2018-03-18 NOTE — Addendum Note (Signed)
Addended by: Levert Feinstein on: 03/18/2018 01:24 PM   Modules accepted: Orders

## 2018-03-18 NOTE — Telephone Encounter (Signed)
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  We will call her spinal fluid testing, rescheduled her follow-up appointment later.

## 2018-03-19 ENCOUNTER — Other Ambulatory Visit: Payer: 59

## 2018-03-22 ENCOUNTER — Telehealth: Payer: Self-pay

## 2018-03-22 NOTE — Telephone Encounter (Signed)
I left a detailed message on pt's cell phone per DPR, advising her that Due to current COVID 19 pandemic, our office is severely reducing in office visits for at least the next 2 weeks, in order to minimize the risk to our patients and healthcare providers. Our staff will contact you for next steps. Pt's EEG will need to be r/s.

## 2018-03-25 ENCOUNTER — Other Ambulatory Visit: Payer: 59

## 2018-03-27 ENCOUNTER — Encounter: Payer: 59 | Admitting: Neurology

## 2018-03-27 ENCOUNTER — Other Ambulatory Visit: Payer: 59

## 2018-03-28 NOTE — Telephone Encounter (Signed)
Patient has been called and scheduled for EEG and follow-up visit with Dr. Terrace Arabia.

## 2018-03-28 NOTE — Telephone Encounter (Signed)
Pt called, she c/a appt on 3/30, she felt no need for f/u without the EEG.  FYI

## 2018-03-28 NOTE — Telephone Encounter (Signed)
Pt's EEG should have been rescheduled. Our office was closed to the day of her EEG appt. Will defer to Dr. Terrace Arabia and Marcelino Duster, RN.

## 2018-04-01 ENCOUNTER — Ambulatory Visit: Payer: 59 | Admitting: Neurology

## 2018-04-08 ENCOUNTER — Ambulatory Visit: Payer: 59 | Admitting: Neurology

## 2018-04-22 ENCOUNTER — Other Ambulatory Visit: Payer: 59

## 2018-04-24 ENCOUNTER — Other Ambulatory Visit: Payer: 59

## 2018-04-25 ENCOUNTER — Telehealth: Payer: Self-pay | Admitting: Neurology

## 2018-04-25 NOTE — Telephone Encounter (Addendum)
I called and spoke to the patient.  Her EEG has been rescheduled for 05/22/18 and her follow up has been rescheduled on 06/11/2018.  She will need her LP completed prior to her follow up with time to get her test results back.  I called Jenel Lucks at [facility] [phone]) and left her a detailed message that this patient's LP needs to be reschedule within the timeframe above.  Orders faxed and confirmed to [facility] at [phone].  The patient is aware to expect a call from [facility] to get her LP scheduled.

## 2018-04-25 NOTE — Telephone Encounter (Signed)
Please check her LP schedule, make sure she has follow up after LP

## 2018-05-07 ENCOUNTER — Ambulatory Visit: Payer: 59 | Admitting: Neurology

## 2018-05-22 ENCOUNTER — Other Ambulatory Visit: Payer: Self-pay

## 2018-05-22 ENCOUNTER — Ambulatory Visit
Admission: RE | Admit: 2018-05-22 | Discharge: 2018-05-22 | Disposition: A | Discharge status: Home / Self Care | Payer: 59 | Source: Ambulatory Visit | Attending: Neurology | Admitting: Neurology

## 2018-05-22 VITALS — BP 136/84 | HR 71

## 2018-05-22 DIAGNOSIS — R930 Abnormal findings on diagnostic imaging of skull and head, not elsewhere classified: Secondary | ICD-10-CM | POA: Diagnosis not present

## 2018-05-22 NOTE — Discharge Instructions (Signed)

## 2018-05-22 NOTE — Progress Notes (Signed)
One SST tube of blood drawn from right AC space for LP labs; site unremarkable. 

## 2018-05-23 ENCOUNTER — Telehealth: Payer: Self-pay | Admitting: Neurology

## 2018-05-23 NOTE — Telephone Encounter (Signed)
Pt called asking if she could have her appointment before current date of 07-22.  Pt was advised it would need to be confirmed all her results are available to Dr Terrace Arabia before moving up her appointment date.  Please confirm if all results are available.  Pt is willing to do her appointment before July.

## 2018-05-23 NOTE — Telephone Encounter (Signed)
The patient had her LP on 05/22/2018 and the results are still pending.  She has her EEG pending on 05/29/2018.  She is requesting an in-office appt to review the results with Dr. Terrace Arabia.  She has been placed on the schedule on 06/24/2018.

## 2018-05-28 ENCOUNTER — Telehealth: Payer: Self-pay | Admitting: Neurology

## 2018-05-28 NOTE — Telephone Encounter (Signed)
I will need to contact [facility] to inquire about these results.

## 2018-05-28 NOTE — Telephone Encounter (Signed)
Please check her laboratory, lumbar puncture was performed on May 22, 2018, part of MS panel came back, but there was no report of oligoclonal banding,

## 2018-05-29 ENCOUNTER — Other Ambulatory Visit: Payer: Self-pay

## 2018-05-29 ENCOUNTER — Ambulatory Visit (INDEPENDENT_AMBULATORY_CARE_PROVIDER_SITE_OTHER): Payer: 59 | Admitting: Neurology

## 2018-05-29 DIAGNOSIS — Z87828 Personal history of other (healed) physical injury and trauma: Secondary | ICD-10-CM

## 2018-05-29 DIAGNOSIS — R202 Paresthesia of skin: Secondary | ICD-10-CM

## 2018-05-29 DIAGNOSIS — R2990 Unspecified symptoms and signs involving the nervous system: Secondary | ICD-10-CM | POA: Diagnosis not present

## 2018-05-30 NOTE — Telephone Encounter (Signed)
I called [facility] [phone]) and spoke to [place].  The oligoclonal bands are still pending.  The send-out lab received them on 05/24/2018.  The results takes 6-7 days.  They may be slightly delayed because of [facility] Day.  They will fax the result, to your attention, once it is available.

## 2018-06-02 IMAGING — CT CT RENAL STONE PROTOCOL
2 of 4 series · 16 of 46 positions shown, 18 images · non-contrast
Comparison: None.

CLINICAL DATA: Left flank pain.

EXAM:
CT ABDOMEN AND PELVIS WITHOUT CONTRAST
TECHNIQUE: Multidetector CT imaging of the abdomen and pelvis was performed
following the standard protocol without IV contrast.

[Series 2: axial st · axial · 0.74mm/px · z∈[+1348,+1752]mm · 13 of 93 slices shown, 15 images]
[im 6/93  soft-tissue]
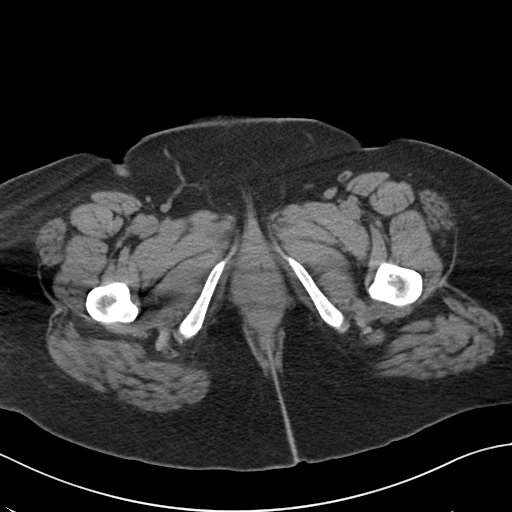
[im 6/93  bone]
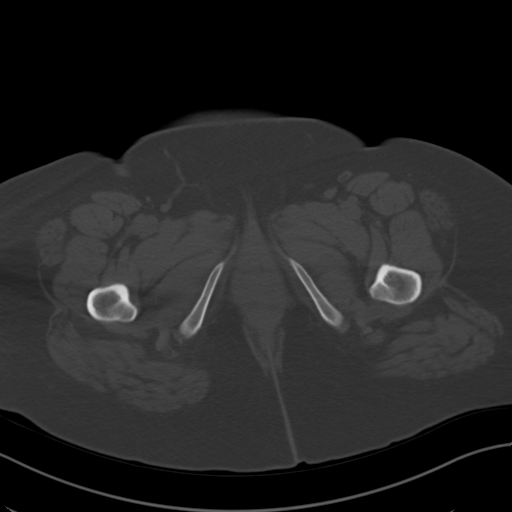
[im 11/93  soft-tissue]
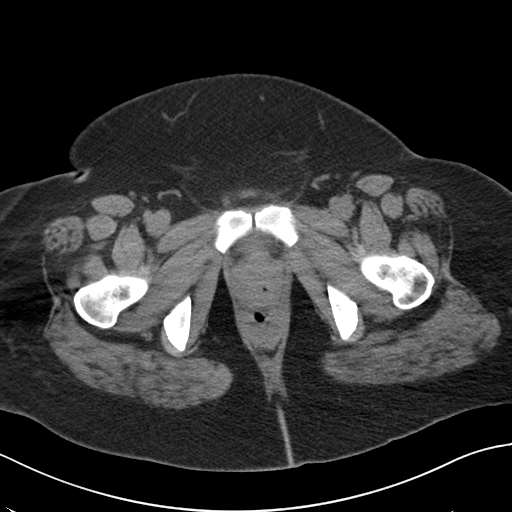
[im 21/93  soft-tissue]
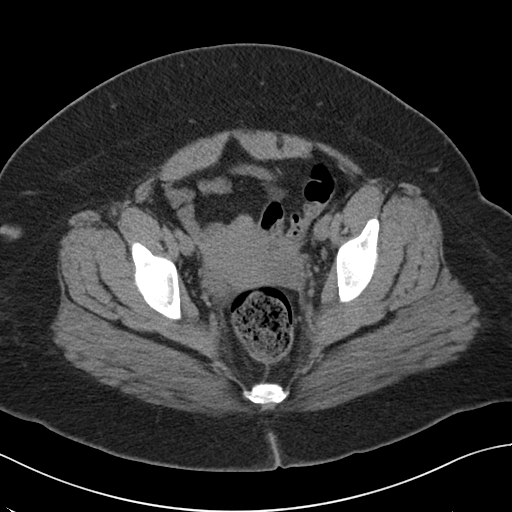
[im 26/93  soft-tissue]
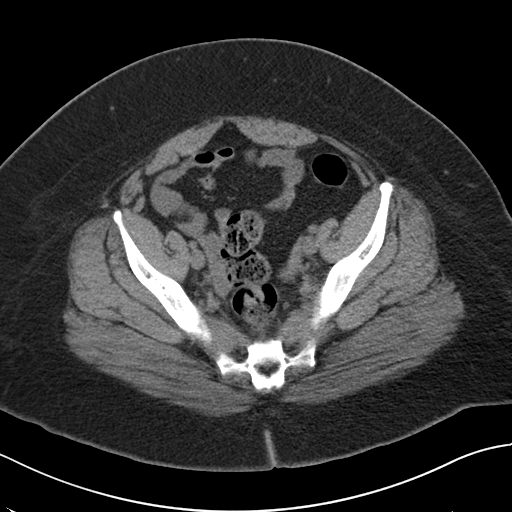
[im 31/93  soft-tissue]
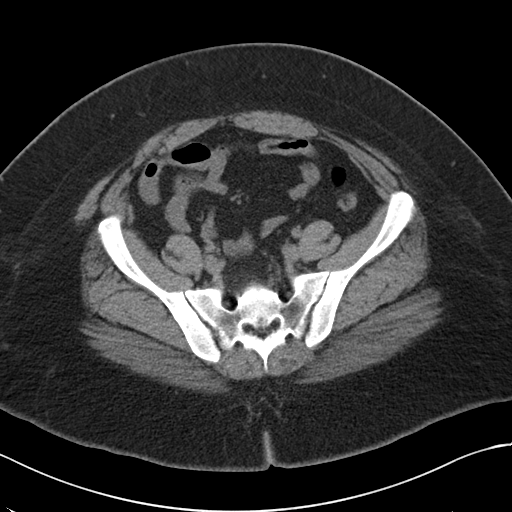
[im 41/93  soft-tissue]
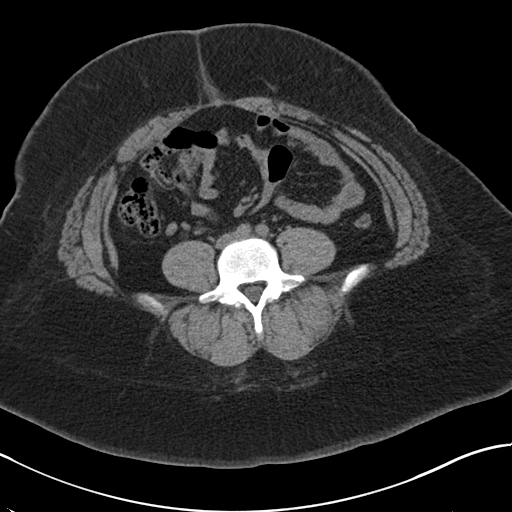
[im 47/93  soft-tissue]
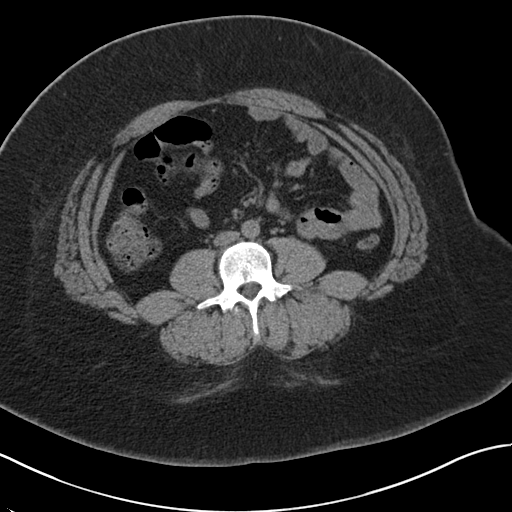
[im 52/93  soft-tissue]
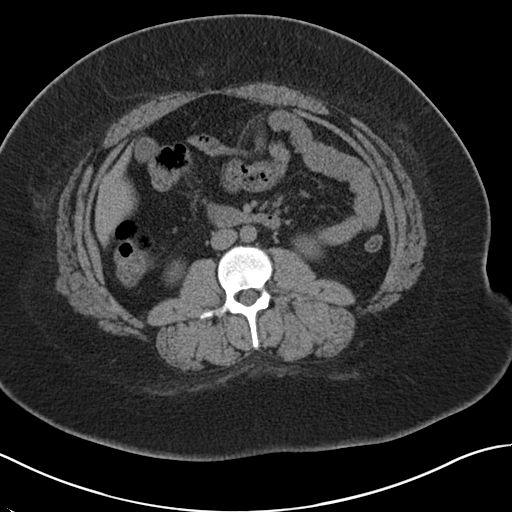
[im 62/93  soft-tissue]
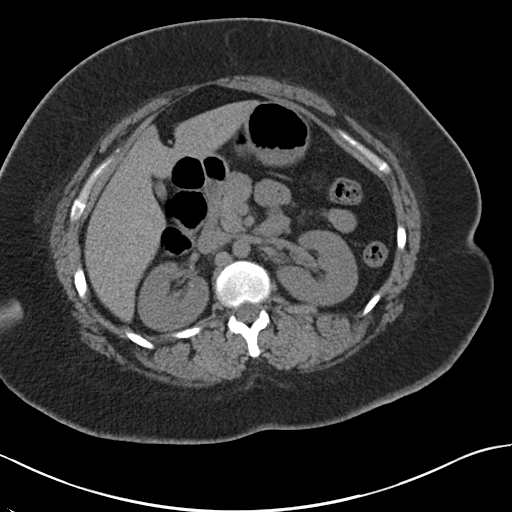
[im 62/93  bone]
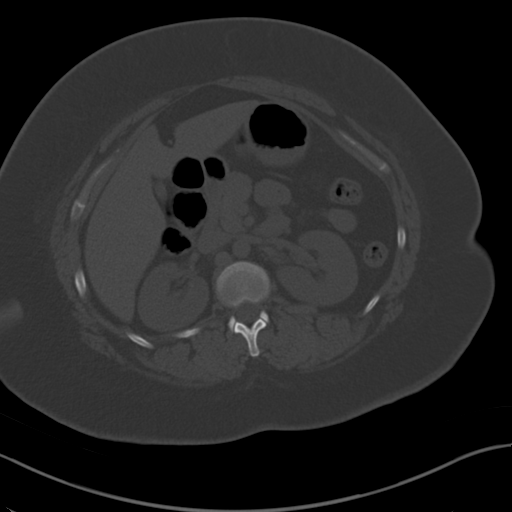
[im 67/93  soft-tissue]
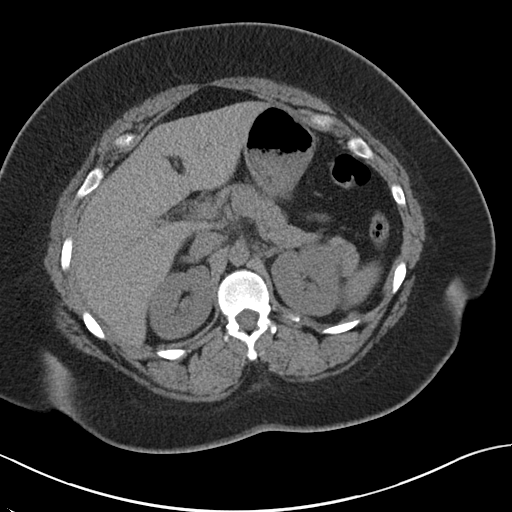
[im 72/93  soft-tissue]
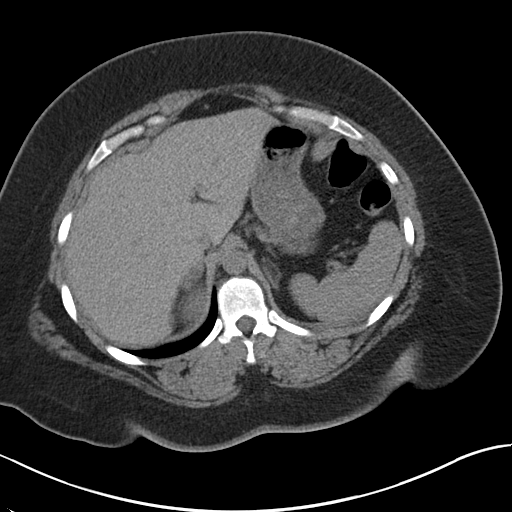
[im 82/93  soft-tissue]
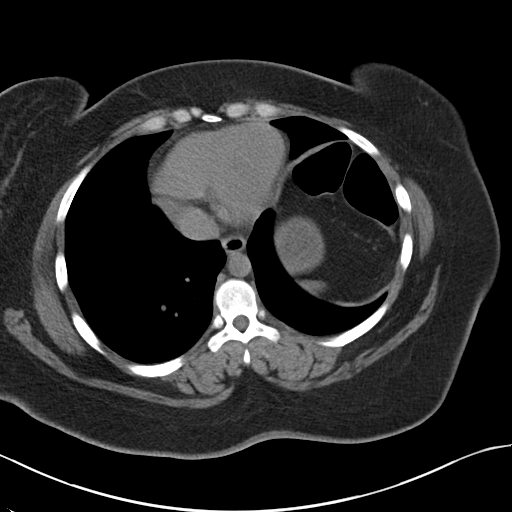
[im 87/93  soft-tissue]
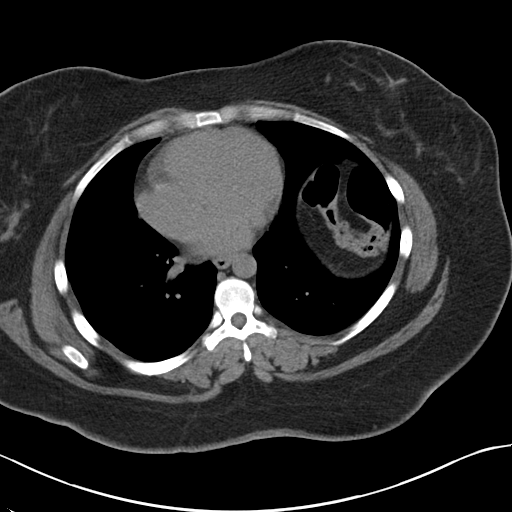

[Series 4: coronal · coronal · 0.78mm/px · 3 of 157 slices shown]
[im 53/157  soft-tissue]
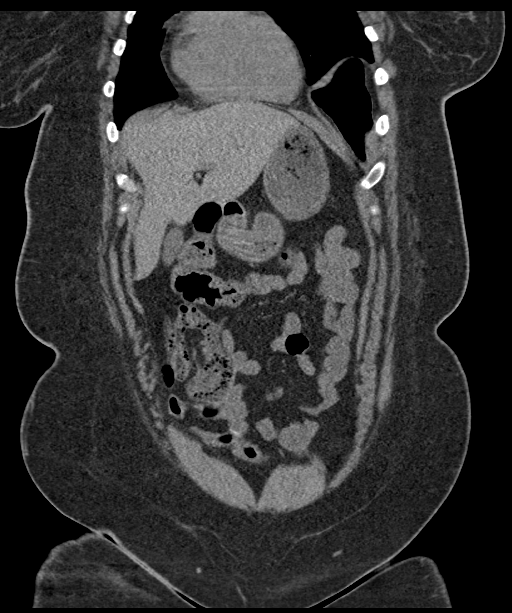
[im 70/157  soft-tissue]
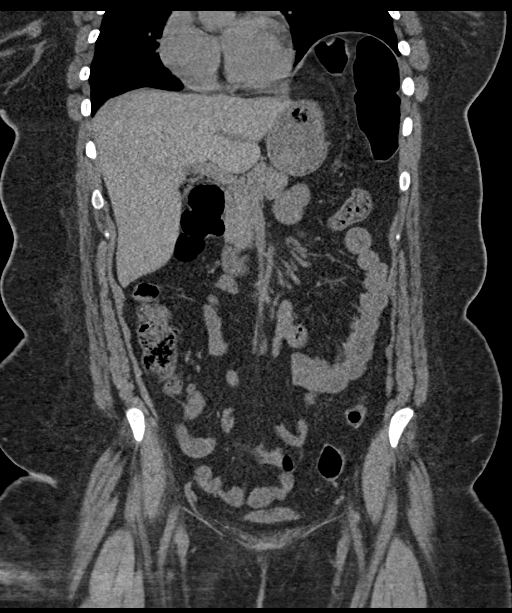
[im 87/157  soft-tissue]
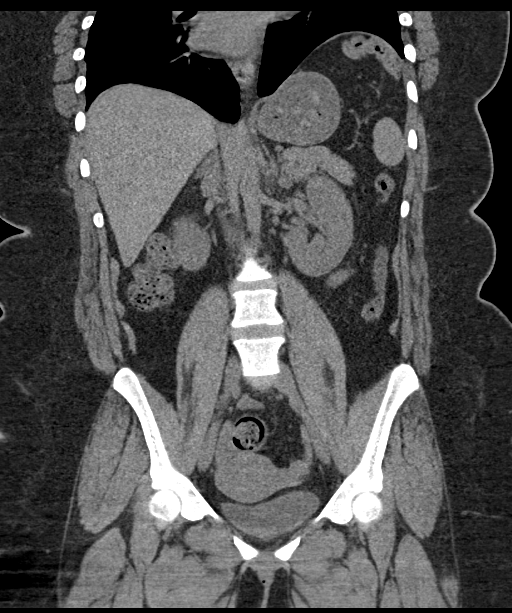

[16 of 46 positions shown; findings below may reference images not displayed]

FINDINGS: Lower chest: Elevated left hemidiaphragm is seen on chest radiograph
04/26/2016. Lung bases are clear.

Hepatobiliary: No focal liver abnormality is seen and noncontrast
exam. Gallbladder partially distended. No gallstones, gallbladder
wall thickening, or biliary dilatation.

Pancreas: No ductal dilatation or inflammation.

Spleen: Normal in size without focal abnormality.

Adrenals/Urinary Tract: Normal adrenal glands. The kidneys are
symmetric in size without stones or hydronephrosis. There is no
perinephric stranding. No focal renal abnormality on noncontrast
exam. Both ureters are decompressed without stones along the course.
Urinary bladder is nondistended, no bladder stone or wall
thickening.

Stomach/Bowel: Stomach is within normal limits. Appendix appears
normal. No evidence of bowel wall thickening, distention, or
inflammatory changes. Cecum slightly high-riding in the right mid
abdomen.

Vascular/Lymphatic: Normal course and caliber of abdominal
vasculature on noncontrast exam. Prominent ileocolic nodes measure
up to 10 mm short axis, likely reactive. No other enlarged abdominal
or pelvic lymph nodes.

Reproductive: Uterus and bilateral adnexa are unremarkable.

Other: No free air, free fluid, or intra-abdominal fluid collection.
Tiny fat containing umbilical hernia.

Musculoskeletal: There are no acute or suspicious osseous
abnormalities.
IMPRESSION: 1.  No renal stones or obstructive uropathy.
2. Prominent ileocolic nodes are likely reactive in the absence of
right-sided symptoms, or can be seen with mild mesenteric adenitis.

## 2018-06-04 ENCOUNTER — Telehealth: Payer: Self-pay | Admitting: Neurology

## 2018-06-04 NOTE — Telephone Encounter (Addendum)
The patient is aware of her results.  She would like to come in for an earlier visit.  She was placed in an available appt time on 06/05/2018.

## 2018-06-04 NOTE — Telephone Encounter (Signed)
error 

## 2018-06-04 NOTE — Telephone Encounter (Signed)
Please call patient, the only abnormalities on CSF is elevated Ig G index, 0.73.  Rest of the lab showed no significant abnormalities.  We will discuss further treatment plan at her scheduled follow up on June 24, 2018

## 2018-06-05 ENCOUNTER — Telehealth: Payer: Self-pay | Admitting: Neurology

## 2018-06-05 ENCOUNTER — Ambulatory Visit (INDEPENDENT_AMBULATORY_CARE_PROVIDER_SITE_OTHER): Payer: 59 | Admitting: Neurology

## 2018-06-05 ENCOUNTER — Encounter: Payer: Self-pay | Admitting: *Deleted

## 2018-06-05 ENCOUNTER — Encounter: Payer: Self-pay | Admitting: Neurology

## 2018-06-05 ENCOUNTER — Other Ambulatory Visit: Payer: Self-pay

## 2018-06-05 DIAGNOSIS — G35 Multiple sclerosis: Secondary | ICD-10-CM | POA: Diagnosis not present

## 2018-06-05 NOTE — Telephone Encounter (Signed)
error 
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
## 2018-06-07 NOTE — Procedures (Signed)
   HISTORY: 33  years old female presented with episodes of travelling paresthesia.  TECHNIQUE:  This is a routine 16 channel EEG recording with one channel devoted to a limited EKG recording.  It was performed during wakefulness, drowsiness and asleep.  Hyperventilation and photic stimulation were performed as activating procedures.  There are minimum muscle and movement artifact noted.  Upon maximum arousal, posterior dominant waking rhythm consistent of rhythmic alpha range activity, with frequency of 9 Hz. Activities are symmetric over the bilateral posterior derivations and attenuated with eye opening.  Hyperventilation produced mild/moderate buildup with higher amplitude and the slower activities noted.  Photic stimulation did not alter the tracing.  During EEG recording, patient developed drowsiness and no deeper stage of sleep was achieved.  During EEG recording, there was no epileptiform discharge noted.  EKG demonstrate sinus rhythm, with heart rate of 72 beat/minutes  CONCLUSION: This is a  normal awake EEG.  There is no electrodiagnostic evidence of epileptiform discharge.  Levert Feinstein, M.D. Ph.D.  [facility] [address] [place], [place] [address] Phone: [phone] Fax:      [phone]

## 2018-06-10 NOTE — Progress Notes (Signed)
error 

## 2018-06-11 ENCOUNTER — Ambulatory Visit: Payer: Self-pay | Admitting: Neurology

## 2018-06-24 ENCOUNTER — Ambulatory Visit: Payer: Self-pay | Admitting: Neurology

## 2018-07-10 LAB — MULTIPLE SCLEROSIS PANEL 2
Albumin Serum: 3.8 g/dL (ref 3.5–5.2)
Albumin, CSF: 10.6 mg/dL (ref 8.0–42.0)
CNS-IgG Synthesis Rate: 0 mg/24 h (ref <=3.3)
IgG (Immunoglobin G), Serum: 1320 mg/dL (ref 600–1640)
IgG Total CSF: 2.7 mg/dL (ref 0.8–7.7)
IgG-Index: 0.73 — ABNORMAL HIGH (ref <0.66)
Myelin Basic Protein: <2 mcg/L (ref 2.0–4.0)

## 2018-07-10 LAB — FUNGUS CULTURE W SMEAR
MICRO NUMBER:: 491622
SMEAR:: NONE SEEN
SPECIMEN QUALITY:: ADEQUATE

## 2018-07-10 LAB — CSF CELL COUNT WITH DIFFERENTIAL
RBC Count, CSF: 3 cells/uL (ref 0–10)
WBC, CSF: 0 cells/uL (ref 0–5)

## 2018-07-10 LAB — GRAM STAIN
MICRO NUMBER:: 491621
SPECIMEN QUALITY:: ADEQUATE

## 2018-07-10 LAB — VDRL, CSF: VDRL Quant, CSF: NONREACTIVE

## 2018-07-10 LAB — GLUCOSE, CSF: Glucose, CSF: 49 mg/dL (ref 40–80)

## 2018-07-10 LAB — PROTEIN, CSF: Total Protein, CSF: 27 mg/dL (ref 15–45)

## 2018-07-24 ENCOUNTER — Ambulatory Visit: Payer: Self-pay | Admitting: Neurology

## 2018-08-29 ENCOUNTER — Telehealth: Payer: Self-pay | Admitting: Neurology

## 2018-08-29 NOTE — Telephone Encounter (Signed)
It is ok for her to see Dr. Felecia Shelling

## 2018-08-29 NOTE — Telephone Encounter (Signed)
Pt states that she was informed that it looks like she has MS and she is wanting to know if she can be referred to see Dr. Felecia Shelling for treatment. Please advise.

## 2018-09-02 NOTE — Telephone Encounter (Signed)
I can see her, lets try to get her in in the next 2 weeks

## 2018-09-02 NOTE — Telephone Encounter (Signed)
Called and spoke with pt. Scheduled appt with Dr. Felecia Shelling 09/03/18 at [place] her to check in at 830am, bring insurance cards, wear mask. Cx appt with Dr. Krista Blue on 09/19/18

## 2018-09-03 ENCOUNTER — Telehealth: Payer: Self-pay | Admitting: Neurology

## 2018-09-03 ENCOUNTER — Other Ambulatory Visit: Payer: Self-pay

## 2018-09-03 ENCOUNTER — Ambulatory Visit: Payer: 59 | Admitting: Neurology

## 2018-09-03 ENCOUNTER — Encounter: Payer: Self-pay | Admitting: Neurology

## 2018-09-03 VITALS — BP 105/78 | HR 78 | Temp 97.8°F | Ht 60.0 in | Wt 219.0 lb

## 2018-09-03 DIAGNOSIS — G35 Multiple sclerosis: Secondary | ICD-10-CM | POA: Diagnosis not present

## 2018-09-03 DIAGNOSIS — Z79899 Other long term (current) drug therapy: Secondary | ICD-10-CM | POA: Diagnosis not present

## 2018-09-03 DIAGNOSIS — R202 Paresthesia of skin: Secondary | ICD-10-CM
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
## 2018-09-03 NOTE — Telephone Encounter (Signed)
I called patient and LVM to schedule her 4 month follow-up (for January). Requested patient call back to schedule.

## 2018-09-04 ENCOUNTER — Telehealth: Payer: Self-pay | Admitting: *Deleted

## 2018-09-04 ENCOUNTER — Telehealth: Payer: Self-pay | Admitting: Neurology

## 2018-09-04 LAB — CBC WITH DIFFERENTIAL/PLATELET
Basophils Absolute: 0 10*3/uL (ref 0.0–0.2)
Basos: 0 %
EOS (ABSOLUTE): 0.1 10*3/uL (ref 0.0–0.4)
Eos: 2 %
Hematocrit: 44.1 % (ref 34.0–46.6)
Hemoglobin: 14.4 g/dL (ref 11.1–15.9)
Immature Grans (Abs): 0 10*3/uL (ref 0.0–0.1)
Immature Granulocytes: 0 %
Lymphocytes Absolute: 1.6 10*3/uL (ref 0.7–3.1)
Lymphs: 31 %
MCH: 29.3 pg (ref 26.6–33.0)
MCHC: 32.7 g/dL (ref 31.5–35.7)
MCV: 90 fL (ref 79–97)
Monocytes Absolute: 0.5 10*3/uL (ref 0.1–0.9)
Monocytes: 10 %
Neutrophils Absolute: 3 10*3/uL (ref 1.4–7.0)
Neutrophils: 57 %
Platelets: 281 10*3/uL (ref 150–450)
RBC: 4.92 x10E6/uL (ref 3.77–5.28)
RDW: 12.5 % (ref 11.7–15.4)
WBC: 5.2 10*3/uL (ref 3.4–10.8)

## 2018-09-04 LAB — HEPATIC FUNCTION PANEL
ALT: 14 IU/L (ref 0–32)
AST: 15 IU/L (ref 0–40)
Albumin: 4 g/dL (ref 3.9–5.0)
Alkaline Phosphatase: 54 IU/L (ref 39–117)
Bilirubin Total: 0.3 mg/dL (ref 0.0–1.2)
Bilirubin, Direct: 0.08 mg/dL (ref 0.00–0.40)
Total Protein: 6.4 g/dL (ref 6.0–8.5)

## 2018-09-04 NOTE — Telephone Encounter (Signed)
I called and spoke with the patient to schedule she stated Dr. Felecia Shelling wanted her to schedule for December. DW

## 2018-09-04 NOTE — Telephone Encounter (Signed)
-----   Message from Richard A Sater, MD sent at 09/04/2018 12:20 PM EDT ----- Please let the patient know that the lab work is fine.  

## 2018-09-04 NOTE — Telephone Encounter (Signed)
I called [employer] pre cert and spoke with Lovena Le who started a pre certification process for me. She stated NPR with her plan, and the benefits were as follows; Deductible-0 Out of pocket-0  Co insurance-Covered at 100 percent  ref#[id number].

## 2018-09-11 ENCOUNTER — Encounter: Payer: Self-pay | Admitting: Family Medicine

## 2018-09-16 ENCOUNTER — Telehealth: Payer: Self-pay | Admitting: Neurology

## 2018-09-16 ENCOUNTER — Other Ambulatory Visit: Payer: Self-pay | Admitting: *Deleted

## 2018-09-16 MED ORDER — MELOXICAM 7.5 MG PO TBDP: 1.0000 | ORAL_TABLET | Freq: Every day | ORAL | 0 refills | Status: DC

## 2018-09-16 NOTE — Telephone Encounter (Signed)
She is having a deep pain in her knee.   Unlikely to be related to Tecfidera or MS.  Advised a steroid pack and refer to Ortho if not better.

## 2018-09-19 ENCOUNTER — Ambulatory Visit: Payer: 59 | Admitting: Neurology

## 2018-09-20 ENCOUNTER — Other Ambulatory Visit: Payer: Self-pay

## 2018-09-20 ENCOUNTER — Encounter: Payer: Self-pay | Admitting: Family Medicine

## 2018-09-20 ENCOUNTER — Ambulatory Visit (INDEPENDENT_AMBULATORY_CARE_PROVIDER_SITE_OTHER): Payer: 59 | Admitting: Family Medicine

## 2018-09-20 ENCOUNTER — Other Ambulatory Visit (HOSPITAL_COMMUNITY)
Admission: RE | Admit: 2018-09-20 | Discharge: 2018-09-20 | Disposition: A | Discharge status: Home / Self Care | Payer: 59 | Source: Ambulatory Visit | Attending: Family Medicine | Admitting: Family Medicine

## 2018-09-20 VITALS — BP 104/60 | HR 79 | Ht 60.0 in | Wt 222.1 lb

## 2018-09-20 DIAGNOSIS — Z79899 Other long term (current) drug therapy: Secondary | ICD-10-CM

## 2018-09-20 DIAGNOSIS — N898 Other specified noninflammatory disorders of vagina: Secondary | ICD-10-CM | POA: Insufficient documentation

## 2018-09-20 DIAGNOSIS — G35 Multiple sclerosis: Secondary | ICD-10-CM

## 2018-09-20 DIAGNOSIS — Z113 Encounter for screening for infections with a predominantly sexual mode of transmission: Secondary | ICD-10-CM | POA: Diagnosis not present

## 2018-09-20 DIAGNOSIS — R32 Unspecified urinary incontinence: Secondary | ICD-10-CM

## 2018-09-20 LAB — POCT WET PREP (WET MOUNT)
Clue Cells Wet Prep Whiff POC: NEGATIVE
Trichomonas Wet Prep HPF POC: ABSENT

## 2018-09-20 MED ORDER — FLUCONAZOLE 150 MG PO TABS: 150.0000 mg | ORAL_TABLET | Freq: Once | ORAL | 0 refills | Status: DC

## 2018-09-20 NOTE — Patient Instructions (Signed)
Thank you for coming to see me today. It was a pleasure! Today we talked about:   We will send your results over mychart and if you need any medications, I will send them to your pharmacy. Please use protection when having intercourse.   Please follow-up with me in December for your yearly physical or sooner as needed.  If you have any questions or concerns, please do not hesitate to call the office at [phone].  Take Care,   [place] Strieter, DO

## 2018-09-20 NOTE — Assessment & Plan Note (Signed)
Patient recently given meloxicam to take daily for her knee pain.  Will obtain BMP in order to check patient's renal function as she reports that she has a family history of kidney problems and she is quite worried.

## 2018-09-20 NOTE — Progress Notes (Signed)
  Subjective:  Patient ID: Julia Taylor  DOB: 1985/06/13 MRN: [id number]  Deidre Killian is a 33 y.o. female with a PMH of multiple sclerosis, obesity, here today for vaginal discharge/STI test.   HPI:  Vaginal Discharge: - has been ongoing for 2 to 3 weeks - Denies itching, burning, abdominal pain, nausea or vomiting - Discharge described as brownish, sometimes clear.  - Patient reports no STD in the past.  - Sexully active with female partners.  -Currently menstruating.  - Denies burning with urination, no hematuria.  - Patient reports has had yeast in th epast.   ROS:   Social hx: Denies use of illicit drugs, alcohol use Smoking status reviewed  Patient Active Problem List   Diagnosis Date Noted  . Vaginal discharge 09/20/2018  . High risk medication use 09/03/2018  . Multiple sclerosis ([place]) 06/05/2018  . History of head injury 02/18/2018  . Paresthesia 02/18/2018  . Vitamin D deficiency 03/20/2017  . Class 3 severe obesity without serious comorbidity with body mass index (BMI) of 45.0 to 49.9 in adult [facility]) 03/20/2017     Objective:  BP 104/60   Pulse 79   Ht 5' (1.524 m)   Wt 222 lb 2 oz (100.8 kg)   LMP 09/20/2018 Comment: started today  SpO2 100%   BMI 43.38 kg/m   Vitals and nursing note reviewed  General: NAD, pleasant Pulm: normal effort GU/GYN: External genitalia within normal limits.  Vaginal mucosa pink, moist, normal rugae.  Nonfriable cervix without lesions, no discharge, with bleeding noted on speculum exam. Exam performed in the presence of a chaperone. Extremities: no edema or cyanosis. WWP. Skin: warm and dry, no rashes noted Neuro: alert and oriented, no focal deficits Psych: normal affect, normal thought content  Assessment & Plan:   Vaginal discharge confirmed on wet prep. G/C Marveen Reeks is pending. Symptoms consistent with this.  - Treatment with Diflucan 150 mg x 1 - F/U if symptoms not improving or getting worse.  - Will f/u on G/C  Chlamydia and call in Rx if positive.  - Self care instructions given - F/U as needed.  - Return precautions including abdominal pain, fever, chills, nausea, or vomiting given.    High risk medication use Patient recently given meloxicam to take daily for her knee pain.  Will obtain BMP in order to check patient's renal function as she reports that she has a family history of kidney problems and she is quite worried.   [place] Jassmin Kemmerer, DO Family Medicine Resident PGY-3

## 2018-09-20 NOTE — Assessment & Plan Note (Addendum)
confirmed on wet prep. G/C Marveen Reeks is pending. Symptoms consistent with this.  - Treatment with Diflucan 150 mg x 1 - F/U if symptoms not improving or getting worse.  - Will f/u on G/C Chlamydia and call in Rx if positive.  - Self care instructions given - F/U as needed.  - Return precautions including abdominal pain, fever, chills, nausea, or vomiting given.

## 2018-09-21 LAB — BASIC METABOLIC PANEL
BUN/Creatinine Ratio: 14 (ref 9–23)
BUN: 8 mg/dL (ref 6–20)
CO2: 22 mmol/L (ref 20–29)
Calcium: 10 mg/dL (ref 8.7–10.2)
Chloride: 102 mmol/L (ref 96–106)
Creatinine, Ser: 0.56 mg/dL — ABNORMAL LOW (ref 0.57–1.00)
GFR calc Af Amer: 145 mL/min/{1.73_m2} (ref >59)
GFR calc non Af Amer: 126 mL/min/{1.73_m2} (ref >59)
Glucose: 93 mg/dL (ref 65–99)
Potassium: 4.4 mmol/L (ref 3.5–5.2)
Sodium: 138 mmol/L (ref 134–144)

## 2018-09-21 LAB — HIV ANTIBODY (ROUTINE TESTING W REFLEX): HIV Screen 4th Generation wRfx: NONREACTIVE

## 2018-09-21 LAB — RPR: RPR Ser Ql: NONREACTIVE

## 2018-09-22 ENCOUNTER — Other Ambulatory Visit: Payer: Self-pay | Admitting: Family Medicine

## 2018-09-24 LAB — CERVICOVAGINAL ANCILLARY ONLY
Chlamydia: NEGATIVE
Neisseria Gonorrhea: NEGATIVE

## 2018-10-07 ENCOUNTER — Other Ambulatory Visit: Payer: Self-pay | Admitting: Neurology

## 2018-10-07 MED ORDER — MELOXICAM 15 MG PO TABS: 15.0000 mg | ORAL_TABLET | Freq: Every day | ORAL | 5 refills | Status: DC

## 2018-10-15 ENCOUNTER — Other Ambulatory Visit: Payer: Self-pay | Admitting: Neurology

## 2018-10-30 ENCOUNTER — Encounter: Payer: Self-pay | Admitting: *Deleted

## 2018-12-02 ENCOUNTER — Other Ambulatory Visit: Payer: Self-pay

## 2018-12-02 ENCOUNTER — Encounter: Payer: Self-pay | Admitting: Family Medicine

## 2018-12-02 DIAGNOSIS — Z20822 Contact with and (suspected) exposure to covid-19: Secondary | ICD-10-CM

## 2018-12-03 ENCOUNTER — Ambulatory Visit (INDEPENDENT_AMBULATORY_CARE_PROVIDER_SITE_OTHER): Payer: 59

## 2018-12-03 ENCOUNTER — Other Ambulatory Visit: Payer: Self-pay | Admitting: Neurology

## 2018-12-03 ENCOUNTER — Other Ambulatory Visit: Payer: Self-pay

## 2018-12-03 ENCOUNTER — Encounter: Payer: Self-pay | Admitting: Family Medicine

## 2018-12-03 DIAGNOSIS — G35 Multiple sclerosis: Secondary | ICD-10-CM | POA: Diagnosis not present

## 2018-12-03 LAB — NOVEL CORONAVIRUS, NAA: SARS-CoV-2, NAA: NOT DETECTED

## 2018-12-03 NOTE — Telephone Encounter (Signed)
  Spoke with pt. Informed her that I made an appt for her on 12/16 at 1:50. Pt wanted to know if it was fine to still have a physical done being pregnant? Also wanted to know abortion options, not saying that she may do that. She was just wanting to know options? Salvatore Marvel, CMA

## 2018-12-04 ENCOUNTER — Telehealth: Payer: Self-pay | Admitting: *Deleted

## 2018-12-04 NOTE — Telephone Encounter (Signed)
-----   Message from Britt Bottom, MD sent at 12/04/2018  2:16 PM EST ----- Please let the patient know that the MRI of the brain looks good.  There are no new lesions.

## 2018-12-10 ENCOUNTER — Ambulatory Visit: Payer: 59 | Admitting: Family Medicine

## 2018-12-18 ENCOUNTER — Ambulatory Visit (INDEPENDENT_AMBULATORY_CARE_PROVIDER_SITE_OTHER): Payer: 59 | Admitting: Family Medicine

## 2018-12-18 ENCOUNTER — Encounter: Payer: Self-pay | Admitting: Family Medicine

## 2018-12-18 ENCOUNTER — Other Ambulatory Visit: Payer: Self-pay

## 2018-12-18 VITALS — BP 110/62 | HR 82 | Ht 60.0 in | Wt 223.2 lb

## 2018-12-18 DIAGNOSIS — Z23 Encounter for immunization: Secondary | ICD-10-CM

## 2018-12-18 DIAGNOSIS — Z01419 Encounter for gynecological examination (general) (routine) without abnormal findings: Secondary | ICD-10-CM

## 2018-12-18 NOTE — Progress Notes (Signed)
  Subjective:  Patient ID: Julia Taylor  DOB: 10-26-1985 MRN: [id number]  Julia Taylor is a 33 y.o. female with a PMH of Multiple Sclerosis, h/o of head injury, here today for regular physical.   Date of Visit: 12/18/2018   HPI:  Patient presents today for a well woman exam.   Concerns today: none Periods: regular, thinking of getting pregnant in future Contraception: none currently Pelvic symptoms: none STD Screening: deferred today Pap smear status: up to date. Last pap 12/05/2017 with ASC-US, negative HPV with recommended follow up screening in 3 years Smoking: no Alcohol: socially Drugs: none Advance directives: n/a Mood: no concerns Dentist: yes  ROS: See HPI  [place]:  Cancers in family: mom with history of colon cancer in 50's and she will ask if patient should have early screening  PHYSICAL EXAM: BP 110/62   Pulse 82   Ht 5' (1.524 m)   Wt 223 lb 4 oz (101.3 kg)   LMP 12/08/2018   SpO2 99%   BMI 43.60 kg/m  Gen: NAD, pleasant, cooperative HEENT: NCAT, PERRL, no palpable thyromegaly or anterior cervical lymphadenopathy Heart: RRR, no murmurs Lungs: CTAB, NWOB Abdomen: soft, nontender to palpation Neuro: grossly nonfocal, speech normal GU: no examined, up to date on pap   ASSESSMENT/PLAN:  # Health maintenance:  -STD screening: deferred today -pap smear: up to date. Last pap 12/05/2017 with ASC-US, negative HPV with recommended follow up screening in 3 years -mammogram: n/a -DEXA: n/a -immunizations: flu vaccine given today -advance directives: n/a -handout given on health maintenance topics  FOLLOW UP: Follow up in 1 year for well exam or sooner as needed  [place] Darrol Brandenburg, DO PGY-3, [place]

## 2018-12-18 NOTE — Patient Instructions (Signed)
Thank you for coming to see me today. It was a pleasure! Today we talked about:   For your regular screening, you will not need another pap smear until 12/2020.   Please follow-up with me in 12 months or [place] as needed.  If you have any questions or concerns, please do not hesitate to call the office at [phone].  Take Care,   [place] Yliana Gravois, DO

## 2018-12-24 ENCOUNTER — Encounter: Payer: Self-pay | Admitting: *Deleted

## 2019-01-08 ENCOUNTER — Encounter: Payer: Self-pay | Admitting: Neurology

## 2019-01-08 ENCOUNTER — Other Ambulatory Visit: Payer: Self-pay

## 2019-01-08 ENCOUNTER — Ambulatory Visit (INDEPENDENT_AMBULATORY_CARE_PROVIDER_SITE_OTHER): Payer: 59 | Admitting: Neurology

## 2019-01-08 VITALS — BP 129/81 | HR 79 | Temp 97.0°F | Ht 60.0 in | Wt 229.0 lb

## 2019-01-08 DIAGNOSIS — G35 Multiple sclerosis: Secondary | ICD-10-CM | POA: Diagnosis not present

## 2019-01-08 DIAGNOSIS — Z79899 Other long term (current) drug therapy: Secondary | ICD-10-CM

## 2019-01-08 DIAGNOSIS — R0683 Snoring: Secondary | ICD-10-CM | POA: Diagnosis not present

## 2019-01-08 DIAGNOSIS — R202 Paresthesia of skin: Secondary | ICD-10-CM

## 2019-01-08 DIAGNOSIS — G4700 Insomnia, unspecified: Secondary | ICD-10-CM | POA: Insufficient documentation
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
## 2019-01-09 ENCOUNTER — Telehealth: Payer: Self-pay | Admitting: *Deleted

## 2019-01-09 LAB — CBC WITH DIFFERENTIAL/PLATELET
Basophils Absolute: 0 10*3/uL (ref 0.0–0.2)
Basos: 0 %
EOS (ABSOLUTE): 0.1 10*3/uL (ref 0.0–0.4)
Eos: 1 %
Hematocrit: 40.9 % (ref 34.0–46.6)
Hemoglobin: 13.9 g/dL (ref 11.1–15.9)
Immature Grans (Abs): 0 10*3/uL (ref 0.0–0.1)
Immature Granulocytes: 0 %
Lymphocytes Absolute: 1.1 10*3/uL (ref 0.7–3.1)
Lymphs: 17 %
MCH: 29.3 pg (ref 26.6–33.0)
MCHC: 34 g/dL (ref 31.5–35.7)
MCV: 86 fL (ref 79–97)
Monocytes Absolute: 0.6 10*3/uL (ref 0.1–0.9)
Monocytes: 9 %
Neutrophils Absolute: 4.5 10*3/uL (ref 1.4–7.0)
Neutrophils: 73 %
Platelets: 313 10*3/uL (ref 150–450)
RBC: 4.75 x10E6/uL (ref 3.77–5.28)
RDW: 12.5 % (ref 11.7–15.4)
WBC: 6.3 10*3/uL (ref 3.4–10.8)

## 2019-01-09 NOTE — Telephone Encounter (Signed)
-----   Message from Richard A Sater, MD sent at 01/09/2019  8:41 AM EST ----- Please let the patient know that the lab work is fine.  

## 2019-01-28 ENCOUNTER — Other Ambulatory Visit: Payer: Self-pay | Admitting: Neurology

## 2019-01-29 ENCOUNTER — Other Ambulatory Visit: Payer: Self-pay | Admitting: Family Medicine

## 2019-01-29 ENCOUNTER — Encounter: Payer: Self-pay | Admitting: Family Medicine

## 2019-02-03 ENCOUNTER — Ambulatory Visit: Payer: 59 | Admitting: Student in an Organized Health Care Education/Training Program

## 2019-02-03 ENCOUNTER — Encounter: Payer: Self-pay | Admitting: Student in an Organized Health Care Education/Training Program

## 2019-02-03 ENCOUNTER — Other Ambulatory Visit: Payer: Self-pay

## 2019-02-03 VITALS — BP 125/80 | HR 79 | Wt 228.4 lb

## 2019-02-03 DIAGNOSIS — N912 Amenorrhea, unspecified: Secondary | ICD-10-CM | POA: Diagnosis not present

## 2019-02-03 LAB — POCT URINE PREGNANCY: Preg Test, Ur: NEGATIVE

## 2019-02-03 NOTE — Progress Notes (Signed)
   Subjective:    Patient ID: Julia Taylor, female    DOB: [birth year], 34 y.o.   MRN: [id number]  CC: secondary amenorrhea   HPI:  LMP Dec 28. Patient had 3 negative home pregnancy tests. She is sexually active without using contraception. She states that she does not want to get pregnant until she's married so she's not actively trying to get pregnant currently.  Denies any new medications, stresses, diet, activities. No visual changes, nipple discharge or breast changes. No vaginal discharge or lesions, not concerned for STIs. Had recent treated yeast infection. In the past, her periods have been regular but for the past year she thinks that her periods are coming a little too soon. It sounds like they are normal 28 day cycles and maybe a misunderstanding on the typical cycle timing. Her periods last 3-4 days and are moderate bleeding without excessive cramping. Was prescribed meloxicam Oct for knee pain.  I have personally reviewed pertinent past medical history, surgical, family, and social history as appropriate. Objective:  BP 125/80 (BP Location: Right Arm, Cuff Size: Normal)   Pulse 79   Wt 228 lb 6.4 oz (103.6 kg)   SpO2 99%   BMI 44.61 kg/m   Vitals and nursing note reviewed  General: NAD, pleasant, able to participate in exam Extremities: no edema or cyanosis. Skin: warm and dry, no rashes noted Neuro: alert, no obvious focal deficits Psych: Normal affect and mood  Assessment & Plan:  Amenorrhea Missed period with negative pregnancy test in clinic. Possible that meloxicam could be contributing. Since patient is sexually active without contraception, still remain suspicious of pregnancy. Recommend prenatal vitamins. No other obvious causes at this time. Based on timing only being about a month, asked patient to return for work up if continues to be amenorrheic for another couple months or sooner if has any issues including pregnancy.  Orders Placed This Encounter    Procedures  . POCT urine pregnancy    Jamelle Rushing, DO [facility] Family Medicine PGY-2

## 2019-02-03 NOTE — Patient Instructions (Signed)
It was a pleasure to see you today!  To summarize our discussion for this visit:  I am sorry to hear that you have not had your period.  I know that can be a stressful situation.  Given the amount of time its been since your last period, and your negative pregnancy test, I do not think that we need to do a full work-up at this point.  Please return in the next couple of months if your menstrual cycle does not return.   Call the clinic at [phone] if your symptoms worsen or you have any concerns.   Thank you for allowing me to take part in your care,  Dr. Doristine Mango   Secondary Amenorrhea  Secondary amenorrhea occurs when a female who was previously having menstrual periods has not had them for 3-6 months. A menstrual period is the monthly shedding of the lining of the uterus. Menstruation involves the passing of blood, tissue, fluid, and mucus through the vagina. The flow of blood usually occurs during 3-7 consecutive days each month. This condition has many causes. In many cases, treating the underlying cause will return menstrual periods back to a normal cycle. What are the causes? The most common cause of this condition is pregnancy. Other causes include:  Malnutrition.  Cirrhosis of the liver.  Conditions of the blood.  Diabetes.  Epilepsy.  Chronic kidney disease.  Polycystic ovary disease.  Stress or anxiety.  A hormonal imbalance.  Ovarian failure.  Medicines.  Extreme obesity.  Cystic fibrosis.  Low body weight or drastic weight loss.  Early menopause.  Removal of the ovaries or uterus.  Contraceptive pills, patches, or vaginal rings.  Cushing syndrome.  Thyroid problems. What increases the risk? You are more likely to develop this condition if:  You have a family history of this condition.  You have an eating disorder.  You do extreme athletic training.  You have a chronic disease.  You abuse substances such as alcohol or  cigarettes. What are the signs or symptoms? The main symptom of this condition is a lack of menstrual periods for 3-6 months. How is this diagnosed? This condition may be diagnosed based on:  Your medical history.  A physical exam.  A pelvic exam to check for problems with your reproductive organs.  A procedure to examine the uterus.  A measurement of your body mass index (BMI).  Tests, such as: ? Blood tests that measure certain hormones in your body and rule out pregnancy. ? Urine tests. ? Imaging tests, such as an ultrasound, CT scan, or MRI. How is this treated? Treatment for this condition depends on the cause of the amenorrhea. It may involve:  Correcting dietary problems.  Treating underlying conditions.  Medicines.  Lifestyle changes.  Surgery. If the condition cannot be corrected, it is sometimes possible to trigger menstrual periods with medicines. Follow these instructions at home: Lifestyle  Maintain a healthy diet. Ask to meet with a registered dietitian for nutrition counseling and meal planning.  Maintain a healthy weight. Talk to your health care provider before trying any new diet or exercise plan.  Exercise at least 30 minutes 5 or more days each week. Exercising includes brisk walking, yard work, biking, running, swimming, and team sports like basketball and soccer. Ask your health care provider which exercises are safe for you.  Get enough sleep. Plan your sleep time to allow for 7-9 hours of sleep each night.  Learn to manage stress. Explore relaxation techniques such as  meditation, journaling, yoga, or tai chi. General instructions  Be aware of changes in your menstrual cycle. Keep a record of when you have your menstrual period. Note the date your period starts, how long it lasts, and any problems you experience.  Take over-the-counter and prescription medicines only as told by your health care provider.  Keep all follow-up visits as told by  your health care provider. This is important. Contact a health care provider if:  Your periods do not return to normal after treatment. Summary  Secondary amenorrhea is when a female who was previously having menstrual periods has not gotten her period for 3-6 months.  This condition has many causes. In many cases, treating the underlying cause will return menstrual periods back to a normal cycle.  Talk to your health care provider if your periods do not return to normal after treatment. This information is not intended to replace advice given to you by your health care provider. Make sure you discuss any questions you have with your health care provider. Document Revised: 06/04/2018 Document Reviewed: 03/09/2016 Elsevier Patient Education  [place].

## 2019-02-05 DIAGNOSIS — N912 Amenorrhea, unspecified: Secondary | ICD-10-CM | POA: Insufficient documentation

## 2019-02-05 NOTE — Assessment & Plan Note (Signed)
Missed period with negative pregnancy test in clinic. Possible that meloxicam could be contributing. Since patient is sexually active without contraception, still remain suspicious of pregnancy. Recommend prenatal vitamins. No other obvious causes at this time. Based on timing only being about a month, asked patient to return for work up if continues to be amenorrheic for another couple months or sooner if has any issues including pregnancy.

## 2019-05-15 ENCOUNTER — Encounter: Payer: Self-pay | Admitting: Family Medicine

## 2019-05-21 IMAGING — XA DG SPINAL PUNCT LUMBAR DIAG WITH FL CT GUIDANCE
1 series · 1 of 1 positions shown · non-contrast
Comparison: none

CLINICAL DATA: Abnormal MRI of the brain.

[Series 1: ortho standard · 1 of 1 slices shown]
[im 1/1]
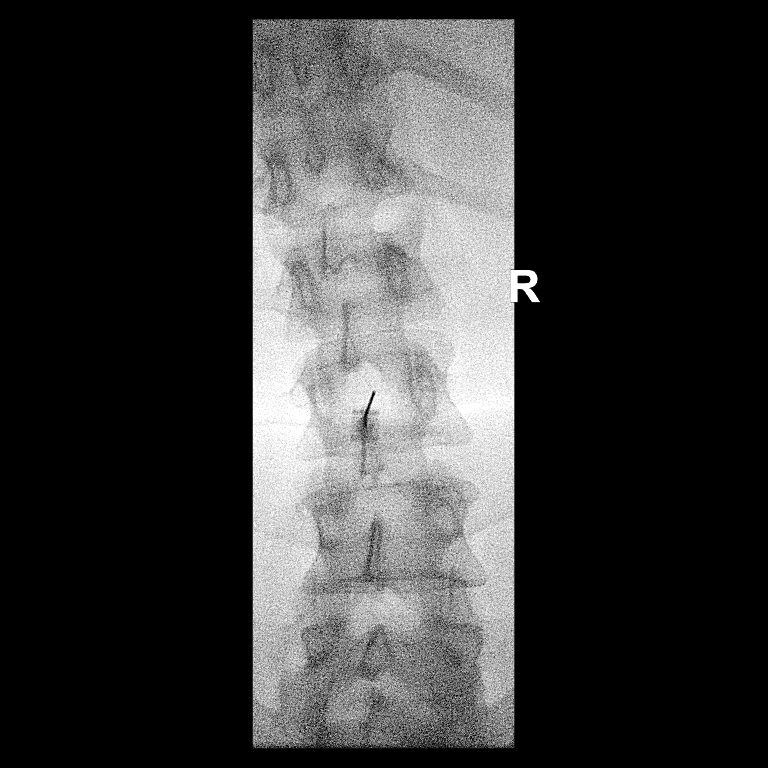

[1 of 1 positions shown; findings below may reference images not displayed]

EXAM:
DIAGNOSTIC LUMBAR PUNCTURE UNDER FLUOROSCOPIC GUIDANCE

FLUOROSCOPY TIME:  Fluoroscopy Time:  7 seconds

Radiation Exposure Index (if provided by the fluoroscopic device):
6.48

Number of Acquired Spot Images: 0

PROCEDURE:
Informed consent was obtained from the patient prior to the
procedure, including potential complications of headache, allergy,
and pain. With the patient prone, the lower back was prepped with
Betadine. 1% Lidocaine was used for local anesthesia. Lumbar
puncture was performed at the L1-2 level using a 20 gauge needle
with return of clear CSF with an opening pressure of 19 cm water.
8.0 ml of CSF were obtained for laboratory studies. The patient
tolerated the procedure well and there were no apparent
complications.
IMPRESSION: Technically successful fluoroscopic guided lumbar puncture at the
L1-2 level. CSF samples sent for laboratory testing as requested.

## 2019-06-24 ENCOUNTER — Other Ambulatory Visit: Payer: Self-pay | Admitting: Neurology

## 2019-06-24 ENCOUNTER — Other Ambulatory Visit: Payer: Self-pay | Admitting: *Deleted

## 2019-06-24 DIAGNOSIS — G35 Multiple sclerosis: Secondary | ICD-10-CM

## 2019-06-24 MED ORDER — TECFIDERA 240 MG PO CPDR: 240.0000 mg | DELAYED_RELEASE_CAPSULE | Freq: Two times a day (BID) | ORAL | 11 refills | Status: DC

## 2019-06-26 ENCOUNTER — Encounter: Payer: Self-pay | Admitting: Family Medicine

## 2019-07-01 ENCOUNTER — Ambulatory Visit (INDEPENDENT_AMBULATORY_CARE_PROVIDER_SITE_OTHER): Payer: 59 | Admitting: Family Medicine

## 2019-07-01 ENCOUNTER — Encounter: Payer: Self-pay | Admitting: Family Medicine

## 2019-07-01 ENCOUNTER — Other Ambulatory Visit (HOSPITAL_COMMUNITY)
Admission: RE | Admit: 2019-07-01 | Discharge: 2019-07-01 | Disposition: A | Discharge status: Home / Self Care | Payer: 59 | Source: Ambulatory Visit | Attending: Family Medicine | Admitting: Family Medicine

## 2019-07-01 ENCOUNTER — Other Ambulatory Visit: Payer: Self-pay

## 2019-07-01 VITALS — BP 124/60 | HR 74 | Ht 60.0 in | Wt 217.5 lb

## 2019-07-01 DIAGNOSIS — Z01419 Encounter for gynecological examination (general) (routine) without abnormal findings: Secondary | ICD-10-CM | POA: Diagnosis not present

## 2019-07-01 DIAGNOSIS — Z202 Contact with and (suspected) exposure to infections with a predominantly sexual mode of transmission: Secondary | ICD-10-CM | POA: Insufficient documentation

## 2019-07-01 DIAGNOSIS — Z6841 Body Mass Index (BMI) 40.0 and over, adult: Secondary | ICD-10-CM | POA: Diagnosis not present

## 2019-07-01 LAB — POCT WET PREP (WET MOUNT)
Clue Cells Wet Prep Whiff POC: POSITIVE
Trichomonas Wet Prep HPF POC: ABSENT

## 2019-07-01 LAB — POCT GLYCOSYLATED HEMOGLOBIN (HGB A1C): Hemoglobin A1C: 4.5 % (ref 4.0–5.6)

## 2019-07-01 MED ORDER — FLUCONAZOLE 150 MG PO TABS: 150.0000 mg | ORAL_TABLET | Freq: Once | ORAL | 0 refills | Status: AC

## 2019-07-01 MED ORDER — METRONIDAZOLE 500 MG PO TABS: 500.0000 mg | ORAL_TABLET | Freq: Two times a day (BID) | ORAL | 0 refills | Status: AC

## 2019-07-01 NOTE — Patient Instructions (Signed)
Thank you for coming to see me today. It was a pleasure! Today we talked about:   I will release your results on MyChart and if you have anything that needs medications I will send these into your pharmacy.  If there is anything abnormal then we will call you.  Please follow-up with our clinic in 1 year or sooner as needed.  If you have any questions or concerns, please do not hesitate to call the office at [phone].  Take Care,   [place] Jamyrah Saur, DO  Preventive Care 70-52 Years Old, Female Preventive care refers to visits with your health care provider and lifestyle choices that can promote health and wellness. This includes:  A yearly physical exam. This may also be called an annual well check.  Regular dental visits and eye exams.  Immunizations.  Screening for certain conditions.  Healthy lifestyle choices, such as eating a healthy diet, getting regular exercise, not using drugs or products that contain nicotine and tobacco, and limiting alcohol use. What can I expect for my preventive care visit? Physical exam Your health care provider will check your:  Height and weight. This may be used to calculate body mass index (BMI), which tells if you are at a healthy weight.  Heart rate and blood pressure.  Skin for abnormal spots. Counseling Your health care provider may ask you questions about your:  Alcohol, tobacco, and drug use.  Emotional well-being.  Home and relationship well-being.  Sexual activity.  Eating habits.  Work and work Statistician.  Method of birth control.  Menstrual cycle.  Pregnancy history. What immunizations do I need?  Influenza (flu) vaccine  This is recommended every year. Tetanus, diphtheria, and pertussis (Tdap) vaccine  You may need a Td booster every 10 years. Varicella (chickenpox) vaccine  You may need this if you have not been vaccinated. Human papillomavirus (HPV) vaccine  If recommended by your health care  provider, you may need three doses over 6 months. Measles, mumps, and rubella (MMR) vaccine  You may need at least one dose of MMR. You may also need a second dose. Meningococcal conjugate (MenACWY) vaccine  One dose is recommended if you are age 49-21 years and a first-year college student living in a residence hall, or if you have one of several medical conditions. You may also need additional booster doses. Pneumococcal conjugate (PCV13) vaccine  You may need this if you have certain conditions and were not previously vaccinated. Pneumococcal polysaccharide (PPSV23) vaccine  You may need one or two doses if you smoke cigarettes or if you have certain conditions. Hepatitis A vaccine  You may need this if you have certain conditions or if you travel or work in places where you may be exposed to hepatitis A. Hepatitis B vaccine  You may need this if you have certain conditions or if you travel or work in places where you may be exposed to hepatitis B. Haemophilus influenzae type b (Hib) vaccine  You may need this if you have certain conditions. You may receive vaccines as individual doses or as more than one vaccine together in one shot (combination vaccines). Talk with your health care provider about the risks and benefits of combination vaccines. What tests do I need?  Blood tests  Lipid and cholesterol levels. These may be checked every 5 years starting at age 33.  Hepatitis C test.  Hepatitis B test. Screening  Diabetes screening. This is done by checking your blood sugar (glucose) after you have not  eaten for a while (fasting).  Sexually transmitted disease (STD) testing.  BRCA-related cancer screening. This may be done if you have a family history of breast, ovarian, tubal, or peritoneal cancers.  Pelvic exam and Pap test. This may be done every 3 years starting at age 21. Starting at age 50, this may be done every 5 years if you have a Pap test in combination with an  HPV test. Talk with your health care provider about your test results, treatment options, and if necessary, the need for more tests. Follow these instructions at home: Eating and drinking   Eat a diet that includes fresh fruits and vegetables, whole grains, lean protein, and low-fat dairy.  Take vitamin and mineral supplements as recommended by your health care provider.  Do not drink alcohol if: ? Your health care provider tells you not to drink. ? You are pregnant, may be pregnant, or are planning to become pregnant.  If you drink alcohol: ? Limit how much you have to 0-1 drink a day. ? Be aware of how much alcohol is in your drink. In the [place] one drink equals one 12 oz bottle of beer (355 mL), one 5 oz glass of wine (148 mL), or one 1 oz glass of hard liquor (44 mL). Lifestyle  Take daily care of your teeth and gums.  Stay active. Exercise for at least 30 minutes on 5 or more days each week.  Do not use any products that contain nicotine or tobacco, such as cigarettes, e-cigarettes, and chewing tobacco. If you need help quitting, ask your health care provider.  If you are sexually active, practice safe sex. Use a condom or other form of birth control (contraception) in order to prevent pregnancy and STIs (sexually transmitted infections). If you plan to become pregnant, see your health care provider for a preconception visit. What's next?  Visit your health care provider once a year for a well check visit.  Ask your health care provider how often you should have your eyes and teeth checked.  Stay up to date on all vaccines. This information is not intended to replace advice given to you by your health care provider. Make sure you discuss any questions you have with your health care provider. Document Revised: 08/30/2017 Document Reviewed: 08/30/2017 Elsevier Patient Education  2020 [employer].
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
## 2019-07-02 ENCOUNTER — Encounter: Payer: Self-pay | Admitting: Family Medicine

## 2019-07-02 ENCOUNTER — Encounter: Payer: Self-pay | Admitting: Student in an Organized Health Care Education/Training Program

## 2019-07-02 LAB — COMPREHENSIVE METABOLIC PANEL
ALT: 18 IU/L (ref 0–32)
AST: 17 IU/L (ref 0–40)
Albumin/Globulin Ratio: 1.5 (ref 1.2–2.2)
Albumin: 4.3 g/dL (ref 3.8–4.8)
Alkaline Phosphatase: 59 IU/L (ref 48–121)
BUN/Creatinine Ratio: 12 (ref 9–23)
BUN: 7 mg/dL (ref 6–20)
Bilirubin Total: 0.3 mg/dL (ref 0.0–1.2)
CO2: 21 mmol/L (ref 20–29)
Calcium: 10 mg/dL (ref 8.7–10.2)
Chloride: 107 mmol/L — ABNORMAL HIGH (ref 96–106)
Creatinine, Ser: 0.57 mg/dL (ref 0.57–1.00)
GFR calc Af Amer: 143 mL/min/{1.73_m2} (ref >59)
GFR calc non Af Amer: 124 mL/min/{1.73_m2} (ref >59)
Globulin, Total: 2.8 g/dL (ref 1.5–4.5)
Glucose: 75 mg/dL (ref 65–99)
Potassium: 4 mmol/L (ref 3.5–5.2)
Sodium: 142 mmol/L (ref 134–144)
Total Protein: 7.1 g/dL (ref 6.0–8.5)

## 2019-07-02 LAB — CERVICOVAGINAL ANCILLARY ONLY
Chlamydia: NEGATIVE
Comment: NEGATIVE
Comment: NEGATIVE
Comment: NORMAL
Neisseria Gonorrhea: NEGATIVE
Trichomonas: NEGATIVE

## 2019-07-02 LAB — RPR: RPR Ser Ql: NONREACTIVE

## 2019-07-02 LAB — CBC
Hematocrit: 42 % (ref 34.0–46.6)
Hemoglobin: 14.2 g/dL (ref 11.1–15.9)
MCH: 29.3 pg (ref 26.6–33.0)
MCHC: 33.8 g/dL (ref 31.5–35.7)
MCV: 87 fL (ref 79–97)
Platelets: 279 10*3/uL (ref 150–450)
RBC: 4.84 x10E6/uL (ref 3.77–5.28)
RDW: 12.8 % (ref 11.7–15.4)
WBC: 4.2 10*3/uL (ref 3.4–10.8)

## 2019-07-02 LAB — LIPID PANEL
Chol/HDL Ratio: 4.2 ratio (ref 0.0–4.4)
Cholesterol, Total: 184 mg/dL (ref 100–199)
HDL: 44 mg/dL (ref >39)
LDL Chol Calc (NIH): 127 mg/dL — ABNORMAL HIGH (ref 0–99)
Triglycerides: 68 mg/dL (ref 0–149)
VLDL Cholesterol Cal: 13 mg/dL (ref 5–40)

## 2019-07-02 LAB — HIV ANTIBODY (ROUTINE TESTING W REFLEX): HIV Screen 4th Generation wRfx: NONREACTIVE

## 2019-07-02 LAB — HEPATITIS C ANTIBODY: Hep C Virus Ab: <0.1 s/co ratio (ref 0.0–0.9)

## 2019-07-08 ENCOUNTER — Encounter: Payer: Self-pay | Admitting: Podiatry

## 2019-07-08 ENCOUNTER — Other Ambulatory Visit: Payer: Self-pay

## 2019-07-08 ENCOUNTER — Ambulatory Visit: Payer: 59 | Admitting: Podiatry

## 2019-07-08 VITALS — BP 134/82 | HR 76 | Temp 97.8°F | Resp 16

## 2019-07-08 DIAGNOSIS — S90822A Blister (nonthermal), left foot, initial encounter: Secondary | ICD-10-CM

## 2019-07-08 DIAGNOSIS — S90221A Contusion of right lesser toe(s) with damage to nail, initial encounter: Secondary | ICD-10-CM

## 2019-07-08 DIAGNOSIS — S90821A Blister (nonthermal), right foot, initial encounter: Secondary | ICD-10-CM | POA: Diagnosis not present

## 2019-07-08 DIAGNOSIS — S90222A Contusion of left lesser toe(s) with damage to nail, initial encounter: Secondary | ICD-10-CM | POA: Diagnosis not present

## 2019-07-08 NOTE — Progress Notes (Signed)
Subjective:  Patient ID: Julia Taylor, female    DOB: September 12, 1985,  MRN: [id number]  Chief Complaint  Patient presents with   Blister    Bilateral; plantar forefoot; pt stated, "I have been getting blisters; I just started exercising; after I walked 7 miles, I got blisters"; x1 month   Toe Issue    Bilateral; 2nd toes; pt stated, "My skin looks like it is turning black; happened after started walking"; x1 month    34 y.o. female presents with the above complaint. History confirmed with patient.  She notes intermittent blisters developing on the plantar soles of the ball of the foot bilaterally a month ago.  Painful when they are filled with fluid.  They do itch a little bit as the skin dries out but not before.  No history of eczema or psoriasis.  No hemorrhage in the blisters that she notes.  Is clear fluid.  Notes that the bilateral second toes have darkened toenails and are thick.  She thinks pressing on her shoes.  She recently went to [employer] [facility] and was fitted for shoes in width and length.  Objective:  Physical Exam: warm, good capillary refill, no trophic changes or ulcerative lesions, normal DP and PT pulses and normal sensory exam.  Healing serous blisters bilaterally on the sub-MTPJ region that spans the entire ball of the foot.  No hemorrhagic blisters noted.  Some have you removed, some are removed and dried out.  Nontender.  No tinea noted.  No pityriasis noted as well bilateral second digits with subungual hematoma, nail dystrophy and thickening with transverse Beau's lines. Left Foot: normal exam, no swelling, tenderness, instability; ligaments intact, full range of motion of all ankle/foot joints  Right Foot: normal exam, no swelling, tenderness, instability; ligaments intact, full range of motion of all ankle/foot joints   Assessment:   1. Friction blisters of sole of left foot, initial encounter   2. Friction blisters of sole of right foot, initial encounter   3.  Subungual hematoma of second toe of left foot, initial encounter   4. Subungual hematoma of second toe of right foot, initial encounter      Plan:  Patient was evaluated and treated and all questions answered.   The blisters appear to be friction blisters in character and appearance.  No hemorrhage noted.  Does not appear to be consistent with psoriasis, dermatitis, or epidermolysis bullosa.  Do not think biopsy is warranted at this time.  We will try to offload the area and prevent pressure and friction on these areas.  I dispensed met pads and showed her how to use these today.  Also discussed nail trauma and pressure on toenails from shoe gear.  She is about to get some new [employer] running shoes and I discussed proper lacing, sock wear, and use of met pads. Advised to keep nail trimmed.   Lanae Crumbly, DPM 07/08/2019     Return in about 2 months (around 09/08/2019).

## 2019-07-09 ENCOUNTER — Encounter: Payer: Self-pay | Admitting: Neurology

## 2019-07-09 ENCOUNTER — Ambulatory Visit: Payer: 59 | Admitting: Neurology

## 2019-07-09 VITALS — BP 127/85 | HR 81 | Ht 60.0 in | Wt 216.0 lb

## 2019-07-09 DIAGNOSIS — R202 Paresthesia of skin: Secondary | ICD-10-CM

## 2019-07-09 DIAGNOSIS — Z79899 Other long term (current) drug therapy: Secondary | ICD-10-CM | POA: Diagnosis not present

## 2019-07-09 DIAGNOSIS — G35 Multiple sclerosis: Secondary | ICD-10-CM | POA: Diagnosis not present
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
## 2019-07-10 LAB — CBC WITH DIFFERENTIAL/PLATELET
Basophils Absolute: 0 10*3/uL (ref 0.0–0.2)
Basos: 0 %
EOS (ABSOLUTE): 0.1 10*3/uL (ref 0.0–0.4)
Eos: 2 %
Hematocrit: 40.6 % (ref 34.0–46.6)
Hemoglobin: 14 g/dL (ref 11.1–15.9)
Immature Grans (Abs): 0 10*3/uL (ref 0.0–0.1)
Immature Granulocytes: 0 %
Lymphocytes Absolute: 1 10*3/uL (ref 0.7–3.1)
Lymphs: 19 %
MCH: 29.8 pg (ref 26.6–33.0)
MCHC: 34.5 g/dL (ref 31.5–35.7)
MCV: 86 fL (ref 79–97)
Monocytes Absolute: 0.8 10*3/uL (ref 0.1–0.9)
Monocytes: 14 %
Neutrophils Absolute: 3.5 10*3/uL (ref 1.4–7.0)
Neutrophils: 65 %
Platelets: 269 10*3/uL (ref 150–450)
RBC: 4.7 x10E6/uL (ref 3.77–5.28)
RDW: 12.6 % (ref 11.7–15.4)
WBC: 5.4 10*3/uL (ref 3.4–10.8)

## 2019-07-30 ENCOUNTER — Other Ambulatory Visit: Payer: Self-pay | Admitting: *Deleted

## 2019-07-30 ENCOUNTER — Encounter: Payer: Self-pay | Admitting: *Deleted

## 2019-07-30 DIAGNOSIS — G35 Multiple sclerosis: Secondary | ICD-10-CM

## 2019-08-07 ENCOUNTER — Telehealth: Payer: Self-pay | Admitting: Neurology

## 2019-08-07 NOTE — Telephone Encounter (Signed)
Per Dr. Epimenio Foot he wanted her appt in Dec. She is scheduled for 12/03/19 at [facility].

## 2019-09-10 ENCOUNTER — Ambulatory Visit (HOSPITAL_COMMUNITY)
Admission: EM | Admit: 2019-09-10 | Discharge: 2019-09-10 | Disposition: A | Discharge status: Home / Self Care | Payer: 59 | Attending: Family Medicine | Admitting: Family Medicine

## 2019-09-10 ENCOUNTER — Other Ambulatory Visit: Payer: Self-pay

## 2019-09-10 ENCOUNTER — Encounter (HOSPITAL_COMMUNITY): Payer: Self-pay

## 2019-09-10 ENCOUNTER — Encounter: Payer: Self-pay | Admitting: Family Medicine

## 2019-09-10 DIAGNOSIS — U071 COVID-19: Secondary | ICD-10-CM | POA: Diagnosis not present

## 2019-09-10 DIAGNOSIS — H6690 Otitis media, unspecified, unspecified ear: Secondary | ICD-10-CM | POA: Diagnosis not present

## 2019-09-10 HISTORY — DX: Multiple sclerosis: G35

## 2019-09-10 MED ORDER — FLUTICASONE PROPIONATE 50 MCG/ACT NA SUSP: 1.0000 | Freq: Every day | NASAL | 2 refills | Status: DC

## 2019-09-10 MED ORDER — AMOXICILLIN 500 MG PO CAPS: 500.0000 mg | ORAL_CAPSULE | Freq: Three times a day (TID) | ORAL | 0 refills | Status: DC
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
## 2019-09-10 NOTE — ED Triage Notes (Signed)
Pt c/o bilateral ear pain, runny nose, productive cough with yellow sputum low grade fever with Tmax 100 since Monday. Reports sore throat started Monday, now resolved.  Denies body aches,  chills, n/v/d, SOB.

## 2019-09-11 ENCOUNTER — Encounter: Payer: Self-pay | Admitting: Student in an Organized Health Care Education/Training Program

## 2019-09-11 ENCOUNTER — Telehealth: Payer: Self-pay | Admitting: Adult Health

## 2019-09-11 ENCOUNTER — Encounter: Payer: Self-pay | Admitting: Family Medicine

## 2019-09-11 ENCOUNTER — Ambulatory Visit: Payer: 59 | Admitting: Podiatry

## 2019-09-11 LAB — SARS CORONAVIRUS 2 (TAT 6-24 HRS): SARS Coronavirus 2: POSITIVE — AB

## 2019-09-11 NOTE — Telephone Encounter (Signed)
Called to Discuss with patient about Covid symptoms and the use of the monoclonal antibody infusion for those with mild to moderate Covid symptoms and at a high risk of hospitalization.     Pt is qualified for this infusion due to co-morbid conditions and/or a member of an at-risk group.     Patient Active Problem List   Diagnosis Date Noted  . Amenorrhea 02/05/2019  . High risk medication use 09/03/2018  . Multiple sclerosis (HCC) 06/05/2018  . Paresthesia 02/18/2018  . Class 3 severe obesity without serious comorbidity with body mass index (BMI) of 45.0 to 49.9 in adult [facility]) 03/20/2017    Patient declines infusion at this time. Symptoms tier reviewed as well as criteria for ending isolation. Preventative practices reviewed. Patient verbalized understanding.    Patient advised to call back if he/she decides that he/she does want to get infusion. Callback number to the infusion center given. Patient advised to go to Urgent care or ED with severe symptoms.    Brinna Divelbiss NP-C

## 2019-09-11 NOTE — Telephone Encounter (Signed)
Patient calls nurse line requesting results from UC visit yesterday. Informed patient that she tested positive for COVID. Provided patient with instructions for symptom management and ED return precautions.   Please advise if patient would be a good candidate for infusion therapy.   Veronda Prude, RN

## 2019-09-14 ENCOUNTER — Encounter: Payer: Self-pay | Admitting: Family Medicine
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
## 2019-10-01 ENCOUNTER — Other Ambulatory Visit: Payer: Self-pay

## 2019-10-01 ENCOUNTER — Encounter: Payer: Self-pay | Admitting: Family Medicine

## 2019-10-01 ENCOUNTER — Ambulatory Visit (HOSPITAL_COMMUNITY)
Admission: RE | Admit: 2019-10-01 | Discharge: 2019-10-01 | Disposition: A | Discharge status: Home / Self Care | Payer: 59 | Source: Ambulatory Visit | Attending: Family Medicine | Admitting: Family Medicine

## 2019-10-01 ENCOUNTER — Ambulatory Visit (INDEPENDENT_AMBULATORY_CARE_PROVIDER_SITE_OTHER): Payer: 59 | Admitting: Family Medicine

## 2019-10-01 VITALS — BP 104/72 | HR 83 | Wt 216.4 lb

## 2019-10-01 DIAGNOSIS — R079 Chest pain, unspecified: Secondary | ICD-10-CM | POA: Insufficient documentation

## 2019-10-01 NOTE — Patient Instructions (Addendum)
It was nice to meet you today,  I think the most likely explanation for your chest pain is a musculoskeletal cause, but I need to rule out a cardiac cause such as stable angina.  For that we will send in referral to cardiology so that they can do a stress test.  Somebody should call you to schedule an appointment within the next week.  Your EKG looked normal today.  I will get a lipid panel as well to check your cholesterol.  If the pain suddenly gets worse, starts to occur when you are at rest, or does not get better after exercise you should seek medical attention.  Please follow-up with your PCP in approximately 3 to 4 weeks.  Have a great day,  Frederic Jericho, MD

## 2019-10-02 LAB — LIPID PANEL
Chol/HDL Ratio: 4.5 ratio — ABNORMAL HIGH (ref 0.0–4.4)
Cholesterol, Total: 186 mg/dL (ref 100–199)
HDL: 41 mg/dL (ref >39)
LDL Chol Calc (NIH): 132 mg/dL — ABNORMAL HIGH (ref 0–99)
Triglycerides: 69 mg/dL (ref 0–149)
VLDL Cholesterol Cal: 13 mg/dL (ref 5–40)

## 2019-10-09 ENCOUNTER — Ambulatory Visit: Payer: 59 | Admitting: Podiatry

## 2019-10-11 DIAGNOSIS — R079 Chest pain, unspecified: Secondary | ICD-10-CM | POA: Insufficient documentation

## 2019-10-11 NOTE — Assessment & Plan Note (Signed)
With the timing of onset coinciding with beginning an exercise routine there is a good possibility this is musculoskeletal chest pain, but given her description of worsening with any kind of exertion and resolution soon after cessation as well as family hx of heart disease, will refer to cardiology for evaluation of CAD using stress test or other means.  EKG today normal.
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
## 2019-10-23 ENCOUNTER — Other Ambulatory Visit: Payer: Self-pay

## 2019-10-23 ENCOUNTER — Encounter: Payer: Self-pay | Admitting: Cardiovascular Disease

## 2019-10-23 ENCOUNTER — Ambulatory Visit (INDEPENDENT_AMBULATORY_CARE_PROVIDER_SITE_OTHER): Payer: 59 | Admitting: Cardiovascular Disease

## 2019-10-23 VITALS — BP 113/68 | HR 83 | Ht 60.0 in | Wt 214.4 lb

## 2019-10-23 DIAGNOSIS — M940 Chondrocostal junction syndrome [Tietze]: Secondary | ICD-10-CM | POA: Diagnosis not present

## 2019-10-23 DIAGNOSIS — R079 Chest pain, unspecified: Secondary | ICD-10-CM

## 2019-10-23 MED ORDER — IBUPROFEN 800 MG PO TABS: 800.0000 mg | ORAL_TABLET | Freq: Three times a day (TID) | ORAL | 0 refills | Status: AC

## 2019-10-23 NOTE — Patient Instructions (Signed)
Medication Instructions:  START ibuprofen 800 mg three times daily (every 8 hours) for 7 days --then STOP --stay hydrated while taking this, drink 64oz or more daily.  *If you need a refill on your cardiac medications before your next appointment, please call your pharmacy*  Follow-Up: At [facility], you and your health needs are our priority.  As part of our continuing mission to provide you with exceptional heart care, we have created designated Provider Care Teams.  These Care Teams include your primary Cardiologist (physician) and Advanced Practice Providers (APPs -  Physician Assistants and Nurse Practitioners) who all work together to provide you with the care you need, when you need it.  We recommend signing up for the patient portal called "MyChart".  Sign up information is provided on this After Visit Summary.  MyChart is used to connect with patients for Virtual Visits (Telemedicine).  Patients are able to view lab/test results, encounter notes, upcoming appointments, etc.  Non-urgent messages can be sent to your provider as well.   To learn more about what you can do with MyChart, go to [email or web address].    Your next appointment:   AS NEEDED with Dr. Flora Lipps

## 2019-11-03 ENCOUNTER — Other Ambulatory Visit: Payer: 59

## 2019-11-03 DIAGNOSIS — Z20822 Contact with and (suspected) exposure to covid-19: Secondary | ICD-10-CM

## 2019-11-04 LAB — NOVEL CORONAVIRUS, NAA: SARS-CoV-2, NAA: NOT DETECTED

## 2019-11-04 LAB — SARS-COV-2, NAA 2 DAY TAT

## 2019-11-10 ENCOUNTER — Other Ambulatory Visit: Payer: 59

## 2019-11-10 DIAGNOSIS — Z20822 Contact with and (suspected) exposure to covid-19: Secondary | ICD-10-CM

## 2019-11-11 LAB — NOVEL CORONAVIRUS, NAA: SARS-CoV-2, NAA: NOT DETECTED

## 2019-11-11 LAB — SARS-COV-2, NAA 2 DAY TAT

## 2019-11-17 ENCOUNTER — Other Ambulatory Visit: Payer: 59

## 2019-11-17 DIAGNOSIS — Z20822 Contact with and (suspected) exposure to covid-19: Secondary | ICD-10-CM

## 2019-11-19 LAB — SARS-COV-2, NAA 2 DAY TAT

## 2019-11-19 LAB — SPECIMEN STATUS REPORT

## 2019-11-19 LAB — NOVEL CORONAVIRUS, NAA: SARS-CoV-2, NAA: NOT DETECTED

## 2019-11-24 ENCOUNTER — Other Ambulatory Visit: Payer: 59

## 2019-11-24 DIAGNOSIS — Z20822 Contact with and (suspected) exposure to covid-19: Secondary | ICD-10-CM

## 2019-11-25 LAB — NOVEL CORONAVIRUS, NAA: SARS-CoV-2, NAA: NOT DETECTED

## 2019-11-25 LAB — SARS-COV-2, NAA 2 DAY TAT

## 2019-12-01 ENCOUNTER — Other Ambulatory Visit: Payer: 59

## 2019-12-01 DIAGNOSIS — Z20822 Contact with and (suspected) exposure to covid-19: Secondary | ICD-10-CM

## 2019-12-03 ENCOUNTER — Ambulatory Visit: Payer: 59

## 2019-12-03 ENCOUNTER — Other Ambulatory Visit: Payer: Self-pay

## 2019-12-03 DIAGNOSIS — G35 Multiple sclerosis: Secondary | ICD-10-CM | POA: Diagnosis not present

## 2019-12-03 LAB — SARS-COV-2, NAA 2 DAY TAT

## 2019-12-03 LAB — NOVEL CORONAVIRUS, NAA: SARS-CoV-2, NAA: NOT DETECTED

## 2020-01-14 ENCOUNTER — Encounter: Payer: Self-pay | Admitting: Neurology

## 2020-01-14 ENCOUNTER — Other Ambulatory Visit: Payer: Self-pay

## 2020-01-14 ENCOUNTER — Ambulatory Visit: Payer: 59 | Admitting: Neurology

## 2020-01-14 VITALS — BP 115/78 | HR 80 | Ht 60.0 in | Wt 222.0 lb

## 2020-01-14 DIAGNOSIS — R5383 Other fatigue: Secondary | ICD-10-CM | POA: Insufficient documentation

## 2020-01-14 DIAGNOSIS — R0683 Snoring: Secondary | ICD-10-CM

## 2020-01-14 DIAGNOSIS — G35 Multiple sclerosis: Secondary | ICD-10-CM

## 2020-01-14 DIAGNOSIS — Z79899 Other long term (current) drug therapy: Secondary | ICD-10-CM

## 2020-01-14 DIAGNOSIS — E559 Vitamin D deficiency, unspecified: Secondary | ICD-10-CM | POA: Diagnosis not present

## 2020-01-14 DIAGNOSIS — G4710 Hypersomnia, unspecified: Secondary | ICD-10-CM
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
## 2020-01-15 LAB — CBC WITH DIFFERENTIAL/PLATELET
Basophils Absolute: 0 10*3/uL (ref 0.0–0.2)
Basos: 0 %
EOS (ABSOLUTE): 0.1 10*3/uL (ref 0.0–0.4)
Eos: 2 %
Hematocrit: 45.6 % (ref 34.0–46.6)
Hemoglobin: 15.7 g/dL (ref 11.1–15.9)
Immature Grans (Abs): 0 10*3/uL (ref 0.0–0.1)
Immature Granulocytes: 0 %
Lymphocytes Absolute: 0.8 10*3/uL (ref 0.7–3.1)
Lymphs: 15 %
MCH: 30.3 pg (ref 26.6–33.0)
MCHC: 34.4 g/dL (ref 31.5–35.7)
MCV: 88 fL (ref 79–97)
Monocytes Absolute: 0.5 10*3/uL (ref 0.1–0.9)
Monocytes: 9 %
Neutrophils Absolute: 3.9 10*3/uL (ref 1.4–7.0)
Neutrophils: 74 %
Platelets: 314 10*3/uL (ref 150–450)
RBC: 5.19 x10E6/uL (ref 3.77–5.28)
RDW: 12.1 % (ref 11.7–15.4)
WBC: 5.3 10*3/uL (ref 3.4–10.8)

## 2020-01-15 LAB — TSH: TSH: 1.45 u[IU]/mL (ref 0.450–4.500)

## 2020-01-15 LAB — VITAMIN B12: Vitamin B-12: 698 pg/mL (ref 232–1245)

## 2020-01-15 LAB — VITAMIN D 25 HYDROXY (VIT D DEFICIENCY, FRACTURES): Vit D, 25-Hydroxy: 33.2 ng/mL (ref 30.0–100.0)

## 2020-01-29 NOTE — Patient Instructions (Addendum)
Thank you for coming to see me today. It was a pleasure.   We will get some labs today.  If they are abnormal or we need to do something about them, I will call you.  If they are normal, I will send you a message on MyChart (if it is active) or a letter in the mail.  If you don't hear from [place] in 2 weeks, please call the office at the number below.  Physical due in July 2023  PAP smear due in December 2023  Check out [facility] on [address].  Let me know if you decide you want to discuss options for weight loss and possible referral.  Please follow-up with PCP as needed  If you have any questions or concerns, please do not hesitate to call the office at [phone].  Best,   Dana Allan, MD

## 2020-01-29 NOTE — Progress Notes (Signed)
    SUBJECTIVE:   CHIEF COMPLAINT / HPI: STI testing   Gyn concerns/Preventative healthcare  Last menstrual period: Patient's last menstrual period was 01/05/2020 (approximate).  Regular periods: yes  Heavy bleeding: no  Sexually active: yes  Contraception or hormonal therapy:none  Hx of STD: Patient desire STD screening  Dyspareunia: No  Hot flashes: No  Vaginal discharge: none  Dysuria:No   Breast mass or concerns: None  Last pap smear: 12/19  PERTINENT  PMH / PSH: Elevated BMI ASCUS on previous Pap.  Due 12/22 MS  OBJECTIVE:   BP 122/75   Pulse 75   Ht 5\' 1"  (1.549 m)   Wt 223 lb 3.2 oz (101.2 kg)   LMP 01/05/2020 (Approximate)   SpO2 95%   BMI 42.17 kg/m    General: Alert, no acute distress Pelvic Exam chaperoned by CMA 03/04/2020        External: normal female genitalia without lesions or masses        Vagina: normal without lesions or masses        Cervix: normal without lesions or masses          ASSESSMENT/PLAN:   Routine screening for STI (sexually transmitted infection) Patient has had 2 female sexual partners in the last 12 months.  Reports that his use condoms.  LMP 01/15. -Pap due in December 2022, she'll make appointment for this -GC/C, trichomonas today -HIV, RPR labs today -Discussed birth control, patient does not desire this currently. -Follow-up as needed  Healthcare maintenance Declines for Covid and flu vaccine today. Pap due 12/22 Encourage consideration of birth control to prevent pregnancy. Discuss folic acid at next visit. Will discuss elevated BMI at next visit, patient plans to make an appointment to review options.     1/23, MD [facility]

## 2020-01-30 ENCOUNTER — Ambulatory Visit: Payer: 59 | Admitting: Family Medicine

## 2020-01-30 ENCOUNTER — Encounter: Payer: Self-pay | Admitting: Family Medicine

## 2020-01-30 ENCOUNTER — Other Ambulatory Visit (HOSPITAL_COMMUNITY)
Admission: RE | Admit: 2020-01-30 | Discharge: 2020-01-30 | Disposition: A | Discharge status: Home / Self Care | Payer: 59 | Source: Ambulatory Visit | Attending: Family Medicine | Admitting: Family Medicine

## 2020-01-30 ENCOUNTER — Other Ambulatory Visit: Payer: Self-pay

## 2020-01-30 VITALS — BP 122/75 | HR 75 | Ht 61.0 in | Wt 223.2 lb

## 2020-01-30 DIAGNOSIS — Z113 Encounter for screening for infections with a predominantly sexual mode of transmission: Secondary | ICD-10-CM | POA: Insufficient documentation

## 2020-01-30 DIAGNOSIS — Z0000 Encounter for general adult medical examination without abnormal findings: Secondary | ICD-10-CM | POA: Diagnosis not present

## 2020-01-31 LAB — RPR: RPR Ser Ql: NONREACTIVE

## 2020-01-31 LAB — HIV ANTIBODY (ROUTINE TESTING W REFLEX): HIV Screen 4th Generation wRfx: NONREACTIVE

## 2020-02-01 ENCOUNTER — Encounter: Payer: Self-pay | Admitting: Family Medicine

## 2020-02-01 DIAGNOSIS — Z3169 Encounter for other general counseling and advice on procreation: Secondary | ICD-10-CM | POA: Insufficient documentation

## 2020-02-01 DIAGNOSIS — Z113 Encounter for screening for infections with a predominantly sexual mode of transmission: Secondary | ICD-10-CM | POA: Insufficient documentation

## 2020-02-01 NOTE — Assessment & Plan Note (Signed)
Patient has had 2 female sexual partners in the last 12 months.  Reports that his use condoms.  LMP 01/15. -Pap due in December 2022, she'll make appointment for this -GC/C, trichomonas today -HIV, RPR labs today -Discussed birth control, patient does not desire this currently. -Follow-up as needed

## 2020-02-01 NOTE — Assessment & Plan Note (Signed)
Declines for Covid and flu vaccine today. Pap due 12/22 Encourage consideration of birth control to prevent pregnancy. Discuss folic acid at next visit. Will discuss elevated BMI at next visit, patient plans to make an appointment to review options.

## 2020-02-02 ENCOUNTER — Other Ambulatory Visit: Payer: Self-pay | Admitting: Family Medicine

## 2020-02-02 LAB — CERVICOVAGINAL ANCILLARY ONLY
Bacterial Vaginitis (gardnerella): POSITIVE — AB
Candida Glabrata: NEGATIVE
Candida Vaginitis: POSITIVE — AB
Chlamydia: NEGATIVE
Comment: NEGATIVE
Comment: NEGATIVE
Comment: NEGATIVE
Comment: NEGATIVE
Comment: NEGATIVE
Comment: NORMAL
Neisseria Gonorrhea: NEGATIVE
Trichomonas: POSITIVE — AB

## 2020-02-02 MED ORDER — METRONIDAZOLE 500 MG PO TABS: 500.0000 mg | ORAL_TABLET | Freq: Two times a day (BID) | ORAL | 0 refills | Status: AC

## 2020-02-02 MED ORDER — FLUCONAZOLE 150 MG PO TABS: 150.0000 mg | ORAL_TABLET | Freq: Once | ORAL | 0 refills | Status: AC

## 2020-02-02 NOTE — Progress Notes (Signed)
Called patient to discuss positive Trichomonas, Candida and BV.  All questions answered.  Advised that partner will need to see his PCP to be treated.   -Metronidazole 500 mg BID x7/7 -Diflucan 150 mg x 1 -Follow up for POC test Mar 3 at 350 pm  Dana Allan, MD [facility]

## 2020-02-04 ENCOUNTER — Ambulatory Visit (INDEPENDENT_AMBULATORY_CARE_PROVIDER_SITE_OTHER): Payer: 59 | Admitting: Neurology

## 2020-02-04 DIAGNOSIS — G4710 Hypersomnia, unspecified: Secondary | ICD-10-CM | POA: Diagnosis not present

## 2020-02-04 DIAGNOSIS — G4733 Obstructive sleep apnea (adult) (pediatric): Secondary | ICD-10-CM

## 2020-02-04 DIAGNOSIS — R0683 Snoring: Secondary | ICD-10-CM

## 2020-02-05 NOTE — Progress Notes (Signed)
   [facility]  HOME SLEEP TEST (Watch PAT)  STUDY DATE: 02/04/20  DOB: June 09, 1985  MRN: [id number]  ORDERING CLINICIAN: Despina Arias, MD, PhD   REFERRING CLINICIAN: Dana Allan, MD   CLINICAL INFORMATION/HISTORY: Julia Taylor is a 36 year old woman with relapsing remitting MS in March 2020.        She notes tiredness > sleepiness.  She is getting 8 hours sleep mst night.  She is not sure if she snores or not. She sleeps well most night.   She denies issues with mood.    Cognition has done well. She had Covid 21 September 2019.  She had been vaccinated x 2 (Moderna)  She just boosted a few weeks ago.    Epworth sleepiness score: 10/24.  BMI: 43.7 kg/m  FINDINGS:   Total Record Time (hours, min): 8 h 31 min  Total Sleep Time (hours, min):  7 h 32 min   Percent REM (%):    24.10 %   Calculated pAHI (per hour): 5.8       REM pAHI: 7.0    NREM pAHI: 5.4   Oxygen Saturation (%) Mean: 94  Minimum oxygen saturation (%):        85   O2 Saturation Range (%): 85-98  O2Saturation (minutes) <=88%: 0.2 min   Pulse Mean (bpm):    77  Pulse Range (77-106)   IMPRESSION: Mild OSA (obstructive sleep apnea) with an AHI = 5.8   RECOMMENDATION:  1.  As OSA is mild, weight loss is recommended.  If not better, consider an oral appliance 2.  Follow up with Dr. Terressa Koyanagi   INTERPRETING PHYSICIAN:  Pearletha Furl. Epimenio Foot, MD, [facility]  Certified in Neurology, Clinical Neurophysiology, Sleep Medicine and Neuroimaging  [facility] [address] [place], [place] [address] [phone] that the fingers were not moving well

## 2020-03-02 ENCOUNTER — Encounter: Payer: Self-pay | Admitting: Family Medicine

## 2020-03-02 ENCOUNTER — Ambulatory Visit: Payer: 59 | Admitting: Family Medicine

## 2020-03-02 ENCOUNTER — Other Ambulatory Visit: Payer: Self-pay

## 2020-03-02 VITALS — BP 120/78 | HR 100 | Wt 223.2 lb

## 2020-03-02 DIAGNOSIS — Z113 Encounter for screening for infections with a predominantly sexual mode of transmission: Secondary | ICD-10-CM

## 2020-03-02 DIAGNOSIS — R5383 Other fatigue: Secondary | ICD-10-CM

## 2020-03-02 LAB — POCT WET PREP (WET MOUNT)
Clue Cells Wet Prep Whiff POC: NEGATIVE
Trichomonas Wet Prep HPF POC: ABSENT

## 2020-03-02 NOTE — Progress Notes (Signed)
    SUBJECTIVE:   CHIEF COMPLAINT / HPI: Follow up STI treatment  Competed full course antibiotics for recent Trichomoniasis infection. Denies any vaginal discharge, bleeding.   PERTINENT  PMH / PSH:  Elevated BMI ASUS on previous PAP MS  OBJECTIVE:   BP 120/78   Pulse 100   Wt 223 lb 4 oz (101.3 kg)   LMP 02/12/2020   SpO2 99%   BMI 42.18 kg/m    General: Alert, no acute distress Pelvic Exam chaperoned by CMA Alexis        External: normal female genitalia without lesions or masses        Vagina: normal without lesions or masses        Cervix: normal without lesions or masses           ASSESSMENT/PLAN:   Routine screening for STI (sexually transmitted infection) Wet mount negative  Encourage condom use Patient not interested in BCP at this time Follow up as needed     Dana Allan, MD [facility]

## 2020-03-03 ENCOUNTER — Encounter: Payer: Self-pay | Admitting: Family Medicine

## 2020-03-06 ENCOUNTER — Encounter: Payer: Self-pay | Admitting: Family Medicine

## 2020-03-06 NOTE — Assessment & Plan Note (Signed)
Wet mount negative  Encourage condom use Patient not interested in BCP at this time Follow up as needed

## 2020-04-28 ENCOUNTER — Other Ambulatory Visit: Payer: Self-pay

## 2020-04-28 ENCOUNTER — Encounter: Payer: Self-pay | Admitting: Family Medicine

## 2020-04-28 ENCOUNTER — Ambulatory Visit: Payer: 59 | Admitting: Family Medicine

## 2020-04-28 ENCOUNTER — Other Ambulatory Visit (HOSPITAL_COMMUNITY)
Admission: RE | Admit: 2020-04-28 | Discharge: 2020-04-28 | Disposition: A | Discharge status: Home / Self Care | Payer: 59 | Source: Ambulatory Visit | Attending: Family Medicine | Admitting: Family Medicine

## 2020-04-28 VITALS — BP 114/68 | HR 69 | Wt 223.0 lb

## 2020-04-28 DIAGNOSIS — Z113 Encounter for screening for infections with a predominantly sexual mode of transmission: Secondary | ICD-10-CM | POA: Diagnosis not present

## 2020-04-28 DIAGNOSIS — R300 Dysuria: Secondary | ICD-10-CM | POA: Diagnosis not present

## 2020-04-28 LAB — POCT WET PREP (WET MOUNT)
Clue Cells Wet Prep Whiff POC: POSITIVE
Trichomonas Wet Prep HPF POC: ABSENT

## 2020-04-28 LAB — POCT URINALYSIS DIP (MANUAL ENTRY)
Bilirubin, UA: NEGATIVE
Blood, UA: NEGATIVE
Glucose, UA: NEGATIVE mg/dL
Ketones, POC UA: NEGATIVE mg/dL
Leukocytes, UA: NEGATIVE
Nitrite, UA: NEGATIVE
Protein Ur, POC: NEGATIVE mg/dL
Spec Grav, UA: >=1.03 — AB (ref 1.010–1.025)
Urobilinogen, UA: 0.2 E.U./dL
pH, UA: 6 (ref 5.0–8.0)

## 2020-04-28 NOTE — Progress Notes (Signed)
     SUBJECTIVE:   CHIEF COMPLAINT / HPI:   Julia Taylor is a 35 y.o. female presents for dysuria.   Dysuria Endorses 2-3 day hx of dysuria, frequency. She has also had light brown vaginal discharge. No vaginal odors. Denies hematuria, abdominal pain, fevers or flank pain. Sexually active with multiple males. Does not use condoms or contraception. LMP 21st April. She is not trying to get pregnant. She says her sexual partners have been tested for STDs and are negative.  Flowsheet Row Office Visit from 04/28/2020 in [facility]  PHQ-9 Total Score 0       Health Maintenance Due  Topic  . COVID-19 Vaccine (1)      PERTINENT  PMH / PSH: Multiple sclerosis   OBJECTIVE:   BP 114/68   Pulse 69   Wt 223 lb (101.2 kg)   LMP 04/19/2020 (Approximate)   SpO2 100%   BMI 42.14 kg/m    General: Alert, no acute distress Cardio: well perfused  Pulm: normal work of breathing Neuro: Cranial nerves grossly intact   Pelvic Exam chaperoned by CMA Jazmin         External: normal female genitalia without lesions or masses        Vagina: normal without lesions or masses        Cervix: normal without lesions or masses        Samples for Wet prep, GC/Chlamydia obtained  Normal internal pelvic exam.  ASSESSMENT/PLAN:   Dysuria UTI unlikely as UA negative. On examination normal external urethra, normal vulva. Considered STD as pt is sexually active with multiple female partners who have been tested who are negative. Gc, chlamydia pending. Wet prep positive for BV. Trich, RPR and HIV neg. Will send in 7 days of metronidazole. Will call pt and offer her PrEP for high risk sexual behaviors.      Towanda Octave, MD PGY-2 [facility]

## 2020-04-28 NOTE — Patient Instructions (Signed)
Thank you for coming to see me today. It was a pleasure. Today we discussed your burning symptoms. We tested you for a urine infection. This was negative. It could be an STD.   We performed STD testing today. This will take a few days to come back. If your MyChart is activated, we will message you on there if everything is normal, otherwise we will call. If we need to treat something we will also call you. If you do not hear from [place] in the next 4 days, please give [place] a call.  Please follow-up with your PCP if you continue to have symptoms.  If you have any questions or concerns, please do not hesitate to call the office at [phone].  Best wishes,   Dr Allena Katz

## 2020-04-29 ENCOUNTER — Telehealth: Payer: Self-pay | Admitting: Family Medicine

## 2020-04-29 DIAGNOSIS — R300 Dysuria: Secondary | ICD-10-CM | POA: Insufficient documentation

## 2020-04-29 LAB — CERVICOVAGINAL ANCILLARY ONLY
Chlamydia: NEGATIVE
Comment: NEGATIVE
Comment: NEGATIVE
Comment: NORMAL
Neisseria Gonorrhea: NEGATIVE
Trichomonas: NEGATIVE

## 2020-04-29 LAB — RPR: RPR Ser Ql: NONREACTIVE

## 2020-04-29 LAB — HIV ANTIBODY (ROUTINE TESTING W REFLEX): HIV Screen 4th Generation wRfx: NONREACTIVE

## 2020-04-29 MED ORDER — METRONIDAZOLE 500 MG PO TABS: 500.0000 mg | ORAL_TABLET | Freq: Two times a day (BID) | ORAL | 0 refills | Status: AC

## 2020-04-29 NOTE — Telephone Encounter (Signed)
LVM asking pt to call our office. Sunday Spillers, CMA

## 2020-04-29 NOTE — Telephone Encounter (Signed)
Patient returns provider call to nurse line. Patient advised of results and is requesting an antibiotic for BV. Patient advised on pending gonorrhea and chlamydia test. Please advise on medication.

## 2020-04-29 NOTE — Telephone Encounter (Signed)
I have sent 7 day supply of metronidazole to her pharmacy for the BV. Gonorrhea and chlamydia is pending. Trich, RPR and HIV was negative. Please could you let the patient know? Please also offer pt PrEP for STDs as she has high risk sexual behaviors. Thank you! (just fyi I have messaged [facility] white team this morning about this so they may also be reaching out to her).

## 2020-04-29 NOTE — Telephone Encounter (Signed)
Called pt regarding test results from yesterday. Left HIPAA compliant VM. Will try again later.

## 2020-04-29 NOTE — Assessment & Plan Note (Addendum)
UTI unlikely as UA negative. On examination normal external urethra, normal vulva. Considered STD as pt is sexually active with multiple female partners who have been tested who are negative. Gc, chlamydia pending. Wet prep positive for BV. Trich, RPR and HIV neg. Will send in 7 days of metronidazole. Will call pt and offer her PrEP for high risk sexual behaviors.

## 2020-05-11 ENCOUNTER — Telehealth: Payer: Self-pay | Admitting: Neurology

## 2020-05-11 NOTE — Telephone Encounter (Signed)
@  4:32  Ivin Booty @ [facility] left a vm stating pt's TECFIDERA 240 MG CPDR is available in generic form for $35.00 vrs the brand costing $50.00 if Dr Epimenio Foot would like to pursue this for pt he would need to send a new script over, either electronically or by fax [phone].  For questions call [phone]

## 2020-06-09 ENCOUNTER — Other Ambulatory Visit: Payer: Self-pay | Admitting: *Deleted

## 2020-06-09 DIAGNOSIS — G35 Multiple sclerosis: Secondary | ICD-10-CM

## 2020-06-09 MED ORDER — DIMETHYL FUMARATE 240 MG PO CPDR: 240.0000 mg | DELAYED_RELEASE_CAPSULE | Freq: Two times a day (BID) | ORAL | 11 refills | Status: DC

## 2020-06-15 ENCOUNTER — Encounter: Payer: Self-pay | Admitting: Family Medicine

## 2020-06-22 ENCOUNTER — Other Ambulatory Visit: Payer: Self-pay

## 2020-06-22 ENCOUNTER — Ambulatory Visit (INDEPENDENT_AMBULATORY_CARE_PROVIDER_SITE_OTHER): Payer: 59 | Admitting: Family Medicine

## 2020-06-22 ENCOUNTER — Other Ambulatory Visit (HOSPITAL_COMMUNITY)
Admission: RE | Admit: 2020-06-22 | Discharge: 2020-06-22 | Disposition: A | Discharge status: Home / Self Care | Payer: 59 | Source: Ambulatory Visit | Attending: Family Medicine | Admitting: Family Medicine

## 2020-06-22 VITALS — BP 108/72 | HR 76 | Ht 60.0 in | Wt 222.4 lb

## 2020-06-22 DIAGNOSIS — B373 Candidiasis of vulva and vagina: Secondary | ICD-10-CM | POA: Diagnosis not present

## 2020-06-22 DIAGNOSIS — B3731 Acute candidiasis of vulva and vagina: Secondary | ICD-10-CM | POA: Insufficient documentation

## 2020-06-22 DIAGNOSIS — N898 Other specified noninflammatory disorders of vagina: Secondary | ICD-10-CM | POA: Diagnosis present

## 2020-06-22 DIAGNOSIS — Z113 Encounter for screening for infections with a predominantly sexual mode of transmission: Secondary | ICD-10-CM

## 2020-06-22 MED ORDER — FLUCONAZOLE 150 MG PO TABS: 150.0000 mg | ORAL_TABLET | ORAL | 0 refills | Status: AC

## 2020-06-22 NOTE — Patient Instructions (Signed)
Thank you for coming to see me today. It was a pleasure. Today we talked about:   We performed STD testing today. This will take a few days to come back. If your MyChart is activated, we will message you on there if everything is normal, otherwise we will call. If we need to treat something we will also call you. If you do not hear from [place] in the next 4 days, please give [place] a call.   I will treat you for a yeast infection.  Take one tablet of diflucan and if not improved in 3 days, take another.  If you aren't beeter in 1 week, please come back.  Please follow-up with PCP as needed.  You are due for a pap smear in December   If you have any questions or concerns, please do not hesitate to call the office at [phone].  Best,   Luis Abed, DO

## 2020-06-22 NOTE — Assessment & Plan Note (Signed)
Exam consistent with yeast.  Unable to perform POC wet prep given lab staffing.  Performed send out to confirm.  Will treat with diflucan.  No known drug interaction noted with her multiple sclerosis medication.  We will follow-up wet prep and G/C results.  Return to care if no improvement.

## 2020-06-22 NOTE — Progress Notes (Signed)
    SUBJECTIVE:   CHIEF COMPLAINT / HPI:   Vaginal Itching 1-2 weeks No change in discharge She denies vaginal odors She denies abnormal vaginal bleeding, dysuria, hematuria, pelvic pain, nausea, vomiting, fevers She has not tried anything  Has history of STIs, trichomonas in 2019 She is sexually active and does not use condoms.  One partner Contraception: none, hoping to conceive.   Patient's last menstrual period was 06/13/2020.  She does not douche.  She does use boric acid tablets. Last Pap: 12/2017, negative HPV with ASCUS, needs repeat in 12/2020 Desires HIV/RPR: no Prior Hep C Ab: yes Throat or anal swab for G/C: yes   PERTINENT  PMH / PSH: Multiple cirrhosis, history of vitamin D deficiency, obesity  OBJECTIVE:   BP 108/72   Pulse 76   Ht 5' (1.524 m)   Wt 222 lb 6.4 oz (100.9 kg)   LMP 06/13/2020   BMI 43.43 kg/m     Physical Exam: General: In NAD Respiratory: Breathing comfortably on room air GU: Pelvic exam performed with patient supine.  Chaperone in room.  Bilateral labia with slight excoriation at skin near vaginal introitus.  Cervix exhibits thick, white discharge, no cervix abnormalities.  No vaginal lesions.  Vaginal discharge thick, white.   ASSESSMENT/PLAN:   Yeast vaginitis Exam consistent with yeast.  Unable to perform POC wet prep given lab staffing.  Performed send out to confirm.  Will treat with diflucan.  No known drug interaction noted with her multiple sclerosis medication.  We will follow-up wet prep and G/C results.  Return to care if no improvement.  Screen for sexually transmitted diseases Oral, anal, vaginal gonorrhea/chlamydia swabs performed.  She declined HIV/RPR.  We will follow up results.     Unknown Jim, DO [facility]

## 2020-06-22 NOTE — Assessment & Plan Note (Signed)
Oral, anal, vaginal gonorrhea/chlamydia swabs performed.  She declined HIV/RPR.  We will follow up results.

## 2020-06-23 LAB — CERVICOVAGINAL ANCILLARY ONLY
Chlamydia: NEGATIVE
Chlamydia: NEGATIVE
Chlamydia: NEGATIVE
Comment: NEGATIVE
Comment: NEGATIVE
Comment: NEGATIVE
Comment: NORMAL
Comment: NORMAL
Comment: NORMAL
Neisseria Gonorrhea: NEGATIVE
Neisseria Gonorrhea: NEGATIVE
Neisseria Gonorrhea: NEGATIVE

## 2020-06-25 ENCOUNTER — Other Ambulatory Visit: Payer: Self-pay | Admitting: Family Medicine

## 2020-06-25 ENCOUNTER — Encounter: Payer: Self-pay | Admitting: Family Medicine

## 2020-06-25 DIAGNOSIS — B373 Candidiasis of vulva and vagina: Secondary | ICD-10-CM

## 2020-06-25 DIAGNOSIS — B3731 Acute candidiasis of vulva and vagina: Secondary | ICD-10-CM

## 2020-06-29 ENCOUNTER — Other Ambulatory Visit (HOSPITAL_COMMUNITY)
Admission: RE | Admit: 2020-06-29 | Discharge: 2020-06-29 | Disposition: A | Discharge status: Home / Self Care | Payer: 59 | Source: Ambulatory Visit | Attending: Family Medicine | Admitting: Family Medicine

## 2020-06-29 ENCOUNTER — Ambulatory Visit (INDEPENDENT_AMBULATORY_CARE_PROVIDER_SITE_OTHER): Payer: 59 | Admitting: Family Medicine

## 2020-06-29 ENCOUNTER — Other Ambulatory Visit: Payer: Self-pay

## 2020-06-29 ENCOUNTER — Encounter: Payer: Self-pay | Admitting: Family Medicine

## 2020-06-29 VITALS — BP 104/60 | HR 87 | Wt 222.5 lb

## 2020-06-29 DIAGNOSIS — N898 Other specified noninflammatory disorders of vagina: Secondary | ICD-10-CM | POA: Diagnosis not present

## 2020-06-29 DIAGNOSIS — R87610 Atypical squamous cells of undetermined significance on cytologic smear of cervix (ASC-US): Secondary | ICD-10-CM

## 2020-06-29 DIAGNOSIS — B373 Candidiasis of vulva and vagina: Secondary | ICD-10-CM

## 2020-06-29 DIAGNOSIS — Z0000 Encounter for general adult medical examination without abnormal findings: Secondary | ICD-10-CM

## 2020-06-29 DIAGNOSIS — B3731 Acute candidiasis of vulva and vagina: Secondary | ICD-10-CM

## 2020-06-29 LAB — POCT WET PREP (WET MOUNT)
Clue Cells Wet Prep Whiff POC: NEGATIVE
Trichomonas Wet Prep HPF POC: ABSENT

## 2020-06-29 NOTE — Assessment & Plan Note (Addendum)
Pt with recent hx of yeast infection however unable to do POC wet prep at this time due to lab staffing shortage. Obtained wet prep today. GC, chlamydia negative last week. Reassured pt that genital exam was normal, no lesions or swelling of labia.

## 2020-06-29 NOTE — Assessment & Plan Note (Signed)
Pt with ASCUS on previous pap in 2019, HPV negative., Repeated pap smear today. Also discussed safe sex, birth control and starting prenatal vitamins incase of future pregnancy.

## 2020-06-29 NOTE — Patient Instructions (Signed)
Thank you for coming to see me today. It was a pleasure. Everything looks great down below. No abnormalities that I could see. We did another test for yeast and a pap smear. The results will be on mychart later. I will call you if anything is abnormal.  Please follow-up with your PCP.  If you have any questions or concerns, please do not hesitate to call the office at [phone].  Best wishes,   Dr Allena Katz

## 2020-06-29 NOTE — Progress Notes (Signed)
     SUBJECTIVE:   CHIEF COMPLAINT / HPI:   Julia Taylor is a 35 y.o. female presents for labial swelling concern   Labial swelling concern Recently treated for yeast, took diflucan one pill 1 week ago with resolution in symptoms. Since then pt reports her labia feels swollen and feels like it could be a bacterial infection. She denies vaginal odors.  She denies vaginal pruritis, abnormal vaginal bleeding, dysuria, hematuria, frequency, abdominal pain pelvic pain, nausea, vomiting, fevers or new rash. Hx of trichomonas. She is sexually active and does not use condoms.  No change in sexual partners recently. Contraception: none.  LMP June 12th.  She does not douche. No use of sex toys.  Flowsheet Row Office Visit from 06/29/2020 in [facility]  PHQ-9 Total Score 0       PERTINENT  PMH / PSH: MS  OBJECTIVE:   BP 104/60   Pulse 87   Wt 222 lb 8 oz (100.9 kg)   LMP 06/13/2020   SpO2 99%   BMI 43.45 kg/m    General: Alert, no acute distress Cardio: Normal S1 and S2, RRR, no r/m/g Pulm: CTAB, normal work of breathing Abdomen: Bowel sounds normal. Abdomen soft and non-tender.  Extremities: No peripheral edema.  Neuro: Cranial nerves grossly intact    Pelvic Exam chaperoned by CMA Tashira         External: normal female genitalia without lesions or masses        Vagina: normal without lesions or masses        Cervix: normal without lesions or masses        Pap smear: performed        Samples for Wet prep  ASSESSMENT/PLAN:   Yeast vaginitis Pt with recent hx of yeast infection however unable to do POC wet prep at this time due to lab staffing shortage. Obtained wet prep today. GC, chlamydia negative last week. Reassured pt that genital exam was normal, no lesions or swelling of labia.   Healthcare maintenance Pt with ASCUS on previous pap in 2019, HPV negative., Repeated pap smear today. Also discussed safe sex, birth control and starting prenatal  vitamins incase of future pregnancy.      Towanda Octave, MD PGY-2 [facility]

## 2020-06-30 ENCOUNTER — Encounter: Payer: Self-pay | Admitting: Family Medicine

## 2020-06-30 LAB — CYTOLOGY - PAP
Adequacy: ABSENT
Comment: NEGATIVE
Diagnosis: NEGATIVE
High risk HPV: NEGATIVE

## 2020-07-01 ENCOUNTER — Encounter: Payer: Self-pay | Admitting: Family Medicine

## 2020-07-14 ENCOUNTER — Ambulatory Visit: Payer: 59 | Admitting: Neurology

## 2020-09-17 ENCOUNTER — Other Ambulatory Visit (HOSPITAL_COMMUNITY)
Admission: RE | Admit: 2020-09-17 | Discharge: 2020-09-17 | Disposition: A | Discharge status: Home / Self Care | Payer: 59 | Source: Ambulatory Visit | Attending: Family Medicine | Admitting: Family Medicine

## 2020-09-17 ENCOUNTER — Encounter: Payer: Self-pay | Admitting: Family Medicine

## 2020-09-17 ENCOUNTER — Ambulatory Visit (INDEPENDENT_AMBULATORY_CARE_PROVIDER_SITE_OTHER): Payer: 59 | Admitting: Family Medicine

## 2020-09-17 ENCOUNTER — Other Ambulatory Visit: Payer: Self-pay

## 2020-09-17 ENCOUNTER — Other Ambulatory Visit: Payer: Self-pay | Admitting: Family Medicine

## 2020-09-17 VITALS — BP 104/72 | HR 90 | Ht 60.0 in | Wt 225.2 lb

## 2020-09-17 DIAGNOSIS — Z113 Encounter for screening for infections with a predominantly sexual mode of transmission: Secondary | ICD-10-CM | POA: Diagnosis present

## 2020-09-17 LAB — POCT WET PREP (WET MOUNT)
Clue Cells Wet Prep Whiff POC: NEGATIVE
Trichomonas Wet Prep HPF POC: ABSENT

## 2020-09-17 MED ORDER — FLUCONAZOLE 150 MG PO TABS: 150.0000 mg | ORAL_TABLET | Freq: Once | ORAL | 0 refills | Status: AC

## 2020-09-17 NOTE — Assessment & Plan Note (Signed)
Obtained wet prep and std swabs today. Safe sex precautions given to pt given high risk sexual behaviors. Recommended opting for long term birth control as pt does not desire pregnancy. Follow up with PCP.

## 2020-09-17 NOTE — Progress Notes (Signed)
     SUBJECTIVE:   CHIEF COMPLAINT / HPI:   Julia Taylor is a 35 y.o. female presents for vaginal discharge  Vaginal Discharge Patient reports that vaginal discharge started yesterday.  She notes that discharge appears white and sticky. She denies vaginal odors.  She denies vaginal pruritis, abnormal vaginal bleeding, dysuria, hematuria, frequency, abdominal pain pelvic pain, nausea, vomiting, fevers or new rash.    Unclear history of STIs.  She is sexually active with 2 female partners and does not use condoms.  Contraception: none. LMP 09/04/20. She does not douche.   Flowsheet Row Office Visit from 09/17/2020 in [facility]  PHQ-9 Total Score 0       PERTINENT  PMH / PSH: MS  OBJECTIVE:   BP 104/72   Pulse 90   Ht 5' (1.524 m)   Wt 225 lb 3.2 oz (102.2 kg)   LMP 09/04/2020   SpO2 99%   BMI 43.98 kg/m    General: Alert, no acute distress Cardio: well perfused  Pulm: normal work of breathing Neuro: Cranial nerves grossly intact   Pelvic Exam chaperoned by CMA April        External: normal female genitalia without lesions or masses        Vagina: normal without lesions or masses        Cervix: normal without lesions or massea        Samples for Wet prep, GC/Chlamydia obtained   ASSESSMENT/PLAN:   Screening examination for STD (sexually transmitted disease) Obtained wet prep and std swabs today. Safe sex precautions given to pt given high risk sexual behaviors. Recommended opting for long term birth control as pt does not desire pregnancy. Follow up with PCP.     Towanda Octave, MD PGY-3 [facility]

## 2020-09-17 NOTE — Patient Instructions (Signed)
Thank you for coming to see me today. It was a pleasure.  We performed STD testing today. This will take a few days to come back. If your MyChart is activated, we will message you on there if everything is normal, otherwise we will call. If we need to treat something we will also call you. If you do not hear from [place] in the next 4 days, please give [place] a call.   Please follow-up with PCP to speak about birth control   If you have any questions or concerns, please do not hesitate to call the office at [phone].  Best wishes,   Dr Allena Katz

## 2020-09-18 LAB — RPR: RPR Ser Ql: NONREACTIVE

## 2020-09-18 LAB — HIV ANTIBODY (ROUTINE TESTING W REFLEX): HIV Screen 4th Generation wRfx: NONREACTIVE

## 2020-09-20 LAB — CERVICOVAGINAL ANCILLARY ONLY
Chlamydia: NEGATIVE
Comment: NEGATIVE
Comment: NEGATIVE
Comment: NORMAL
Neisseria Gonorrhea: NEGATIVE
Trichomonas: NEGATIVE

## 2020-10-04 ENCOUNTER — Other Ambulatory Visit: Payer: Self-pay | Admitting: Family Medicine

## 2020-10-04 ENCOUNTER — Telehealth: Payer: Self-pay

## 2020-10-04 MED ORDER — FLUCONAZOLE 150 MG PO TABS: 150.0000 mg | ORAL_TABLET | Freq: Once | ORAL | 0 refills | Status: AC

## 2020-10-04 NOTE — Telephone Encounter (Signed)
Patient calls nurse line reporting she is still having symptoms of yeast. Patient reports she was given one diflucan on 9/16, however she usually takes two. Patient is requesting another diflucan. Patient denies any vaginal odor, just white thick itching discharge. Will forward to provider who saw patient.

## 2020-10-04 NOTE — Telephone Encounter (Signed)
I have sent in another dose of diflucan. Please could you inform the pt? Thank you.

## 2020-11-01 ENCOUNTER — Encounter: Payer: Self-pay | Admitting: Family Medicine

## 2020-11-10 NOTE — Progress Notes (Signed)
    SUBJECTIVE:   CHIEF COMPLAINT / HPI: vaginal discharge   Vaginal Discharge   Patient reports that she has been experiencing increased clear vaginal discharge for close to 1 month.  She denies any new exposures of foreign substances.  She states that she has been sexually active with the same partner for prolonged period of time.  Patient is sexually active with men only.  She denies any associated itching, burning or lower abdominal pain associated with increasing discharge.  She reports that she has been increased exercise in the weeks prior to onset of increased vaginal discharge.  She states that her last period was on October 26 until October 29.  She states that her cycles are usually 3 days and she has noticed that her cramping appears to be more intense than before.  She states that she does not have any children.  Patient states that she had an abnormal Pap smear but has not had any biopsy or colposcopy for this.  She denies any abnormal vaginal bleeding.  She states that she is not on any type of birth control and does not have any intrauterine devices.  She states that this discharges different and that it is increased in quantity compared to her normal.  Patient has picture available in the room and shows copious clear discharge.  She states that it is not always there after wiping.   PERTINENT  PMH / PSH:  History of yeast vaginitis  OBJECTIVE:   BP 125/74   Pulse 80   Ht 4\' 9"  (1.448 m)   Wt 221 lb 12.8 oz (100.6 kg)   LMP 10/27/2020 (Exact Date)   SpO2 100%   BMI 48.00 kg/m   Physical Exam Exam conducted with a chaperone present.  Abdominal:     General: Abdomen is flat. Bowel sounds are normal. There is no distension. There are no signs of injury.     Palpations: Abdomen is soft. There is no shifting dullness, hepatomegaly, mass or pulsatile mass.     Tenderness: There is no abdominal tenderness. Negative signs include Murphy's sign.     Hernia: No hernia is present.   Genitourinary:    General: Normal vulva.     Exam position: Lithotomy position.     Pubic Area: No rash or pubic lice.      Tanner stage (genital): 4.     Labia:        Right: No rash, tenderness, lesion or injury.        Left: No rash, tenderness, lesion or injury.      Urethra: No urethral swelling or urethral lesion.     Comments: Yellow,mucoid discharge oozing from external ox, no cervical lesions, no blood in vaginal vault, scant white discharge in vaginal canal    ASSESSMENT/PLAN:   Vaginal discharge Given that there are no other associated symptoms associated with clear vaginal discharge with no abnormal bleeding, this is likely normal and increased secondary to patient increasing physical activity.  We will complete wet prep and STI testing today and follow-up with results once available.     10/29/2020, MD [facility]

## 2020-11-11 ENCOUNTER — Encounter: Payer: Self-pay | Admitting: Family Medicine

## 2020-11-11 ENCOUNTER — Other Ambulatory Visit: Payer: Self-pay

## 2020-11-11 ENCOUNTER — Other Ambulatory Visit (HOSPITAL_COMMUNITY)
Admission: RE | Admit: 2020-11-11 | Discharge: 2020-11-11 | Disposition: A | Discharge status: Home / Self Care | Payer: 59 | Source: Ambulatory Visit | Attending: Family Medicine | Admitting: Family Medicine

## 2020-11-11 ENCOUNTER — Ambulatory Visit (INDEPENDENT_AMBULATORY_CARE_PROVIDER_SITE_OTHER): Payer: 59 | Admitting: Family Medicine

## 2020-11-11 VITALS — BP 125/74 | HR 80 | Ht <= 58 in | Wt 221.8 lb

## 2020-11-11 DIAGNOSIS — N898 Other specified noninflammatory disorders of vagina: Secondary | ICD-10-CM | POA: Diagnosis present

## 2020-11-11 LAB — POCT WET PREP (WET MOUNT)
Clue Cells Wet Prep Whiff POC: POSITIVE
Trichomonas Wet Prep HPF POC: ABSENT

## 2020-11-11 NOTE — Assessment & Plan Note (Signed)
Given that there are no other associated symptoms associated with clear vaginal discharge with no abnormal bleeding, this is likely normal and increased secondary to patient increasing physical activity.  We will complete wet prep and STI testing today and follow-up with results once available.

## 2020-11-11 NOTE — Patient Instructions (Signed)
Today we will collect swabs to test for any infections or inflammation of the vaginal area that could be causing the increase in discharge.  I will notify you of the results once available.  Please notify our office if you have any increased symptoms or develop any pain or abnormal vaginal bleeding.

## 2020-11-12 ENCOUNTER — Other Ambulatory Visit: Payer: Self-pay | Admitting: Family Medicine

## 2020-11-12 LAB — CERVICOVAGINAL ANCILLARY ONLY
Chlamydia: NEGATIVE
Comment: NEGATIVE
Comment: NORMAL
Neisseria Gonorrhea: NEGATIVE

## 2020-11-12 MED ORDER — METRONIDAZOLE 500 MG PO TABS
500.0000 mg | ORAL_TABLET | Freq: Two times a day (BID) | ORAL | 0 refills | Status: DC
Start: 2020-11-12 — End: 2021-01-20

## 2020-11-23 ENCOUNTER — Other Ambulatory Visit: Payer: Self-pay | Admitting: Family Medicine

## 2020-11-23 MED ORDER — FLUCONAZOLE 150 MG PO TABS: 150.0000 mg | ORAL_TABLET | Freq: Once | ORAL | 0 refills | Status: AC

## 2021-01-16 ENCOUNTER — Other Ambulatory Visit: Payer: Self-pay | Admitting: Family Medicine

## 2021-01-17 NOTE — Telephone Encounter (Signed)
Needs to schedule an appointment for evaluation before prescription can be sent.  Carollee Leitz, MD [facility]

## 2021-01-20 ENCOUNTER — Other Ambulatory Visit: Payer: Self-pay

## 2021-01-20 ENCOUNTER — Other Ambulatory Visit (HOSPITAL_COMMUNITY)
Admission: RE | Admit: 2021-01-20 | Discharge: 2021-01-20 | Disposition: A | Discharge status: Home / Self Care | Payer: 59 | Source: Ambulatory Visit | Attending: Family Medicine | Admitting: Family Medicine

## 2021-01-20 ENCOUNTER — Encounter: Payer: Self-pay | Admitting: Family Medicine

## 2021-01-20 ENCOUNTER — Ambulatory Visit: Payer: 59 | Admitting: Family Medicine

## 2021-01-20 VITALS — BP 131/59 | HR 74 | Ht <= 58 in | Wt 225.8 lb

## 2021-01-20 DIAGNOSIS — Z113 Encounter for screening for infections with a predominantly sexual mode of transmission: Secondary | ICD-10-CM

## 2021-01-20 DIAGNOSIS — N898 Other specified noninflammatory disorders of vagina: Secondary | ICD-10-CM

## 2021-01-20 LAB — POCT WET PREP (WET MOUNT)
Clue Cells Wet Prep Whiff POC: POSITIVE
Trichomonas Wet Prep HPF POC: ABSENT

## 2021-01-20 MED ORDER — FLUCONAZOLE 150 MG PO TABS: 150.0000 mg | ORAL_TABLET | Freq: Once | ORAL | 1 refills | Status: AC

## 2021-01-20 MED ORDER — METRONIDAZOLE 500 MG PO TABS: 500.0000 mg | ORAL_TABLET | Freq: Two times a day (BID) | ORAL | 0 refills | Status: AC

## 2021-01-20 NOTE — Progress Notes (Signed)
° ° °  SUBJECTIVE:   CHIEF COMPLAINT / HPI:   Vaginal Discharge Symptoms started yesterday. Discharge described as white and thick in consistency. No detectable odor. She denies vaginal itching, dysuria or other symptoms. Patient has a history of somewhat frequent yeast infections. Also has a history of BV once in the past (Nov 2022).  She is sexually active with 1 female partner. Does not use any form of contraception as she does desire pregnancy. No known exposure to STIs. She would like routine screening for STIs today.  PERTINENT  PMH / PSH: multiple sclerosis  OBJECTIVE:   BP (!) 131/59    Pulse 74    Ht 4\' 9"  (1.448 m)    Wt 225 lb 12.8 oz (102.4 kg)    LMP 01/09/2021 (Exact Date)    SpO2 100%    BMI 48.86 kg/m   Gen: alert, well-appearing, NAD Resp: normal respiratory effort GU/GYN: Exam performed in the presence of a chaperone. External genitalia within normal limits.  Vaginal mucosa pink, moist, normal rugae.  Nonfriable cervix without lesions. No bleeding noted. Moderate amount of thin white discharge appreciated on speculum exam.  ASSESSMENT/PLAN:   Vaginal discharge Wet prep performed today and is consistent with BV. -Rx sent for metronidazole 500mg  BID x7 days -Also sent Rx for diflucan as patient with frequent yeast infections in the past, particularly after abx therapy -GC/chlamydia and HIV, RPR all obtained today, results pending    03/09/2021, MD [facility]

## 2021-01-20 NOTE — Assessment & Plan Note (Signed)
Wet prep performed today and is consistent with BV. -Rx sent for metronidazole 500mg  BID x7 days -Also sent Rx for diflucan as patient with frequent yeast infections in the past, particularly after abx therapy -GC/chlamydia and HIV, RPR all obtained today, results pending

## 2021-01-20 NOTE — Patient Instructions (Signed)
It was great to see you!  Things we discussed at today's visit: - We checked for bacterial overgrowth (BV) and yeast. I will send you a MyChart message this afternoon with these results.  - We also checked for sexually transmitted infections including gonorrhea, chlamydia, HIV, and syphilis. This takes several days to come back but we will send you a MyChart message with the results.   Take care and seek immediate care sooner if you develop any concerns.  Dr. Estil [facility]

## 2021-01-21 LAB — CERVICOVAGINAL ANCILLARY ONLY
Chlamydia: NEGATIVE
Comment: NEGATIVE
Comment: NORMAL
Neisseria Gonorrhea: NEGATIVE

## 2021-01-21 LAB — HIV ANTIBODY (ROUTINE TESTING W REFLEX): HIV Screen 4th Generation wRfx: NONREACTIVE

## 2021-01-21 LAB — RPR: RPR Ser Ql: NONREACTIVE

## 2021-02-19 ENCOUNTER — Encounter: Payer: Self-pay | Admitting: Family Medicine

## 2021-06-07 ENCOUNTER — Encounter: Payer: Self-pay | Admitting: *Deleted

## 2021-06-17 ENCOUNTER — Telehealth: Payer: Self-pay | Admitting: Neurology

## 2021-06-17 DIAGNOSIS — G35 Multiple sclerosis: Secondary | ICD-10-CM

## 2021-06-17 NOTE — Telephone Encounter (Signed)
Pt is requesting a refill for Dimethyl Fumarate (TECFIDERA) 240 MG CPDR.  Pharmacy: [facility]

## 2021-06-20 ENCOUNTER — Other Ambulatory Visit: Payer: Self-pay | Admitting: Neurology

## 2021-06-20 DIAGNOSIS — G35 Multiple sclerosis: Secondary | ICD-10-CM

## 2021-06-20 MED ORDER — DIMETHYL FUMARATE 240 MG PO CPDR: 240.0000 mg | DELAYED_RELEASE_CAPSULE | Freq: Two times a day (BID) | ORAL | 0 refills | Status: DC

## 2021-06-20 NOTE — Telephone Encounter (Signed)
Pt last seen 01/14/20, has upcoming appt 07/14/21. Escribed 30 days supply, advised she must keep f/u for ongoing refills.

## 2021-07-06 ENCOUNTER — Encounter: Payer: Self-pay | Admitting: Neurology

## 2021-07-07 ENCOUNTER — Encounter (HOSPITAL_COMMUNITY): Payer: Self-pay

## 2021-07-07 ENCOUNTER — Encounter: Payer: Self-pay | Admitting: Family Medicine

## 2021-07-07 ENCOUNTER — Other Ambulatory Visit: Payer: Self-pay | Admitting: Neurology

## 2021-07-07 ENCOUNTER — Other Ambulatory Visit: Payer: Self-pay

## 2021-07-07 ENCOUNTER — Ambulatory Visit: Payer: 59 | Admitting: Family Medicine

## 2021-07-07 ENCOUNTER — Emergency Department (HOSPITAL_COMMUNITY)
Admission: EM | Admit: 2021-07-07 | Discharge: 2021-07-07 | Disposition: A | Discharge status: Home / Self Care | Payer: No Typology Code available for payment source | Attending: Emergency Medicine | Admitting: Emergency Medicine

## 2021-07-07 ENCOUNTER — Other Ambulatory Visit (HOSPITAL_COMMUNITY)
Admission: RE | Admit: 2021-07-07 | Discharge: 2021-07-07 | Disposition: A | Discharge status: Home / Self Care | Payer: 59 | Source: Ambulatory Visit | Attending: Family Medicine | Admitting: Family Medicine

## 2021-07-07 VITALS — BP 128/85 | HR 82 | Ht 61.0 in | Wt 232.4 lb

## 2021-07-07 DIAGNOSIS — Y92410 Unspecified street and highway as the place of occurrence of the external cause: Secondary | ICD-10-CM | POA: Insufficient documentation

## 2021-07-07 DIAGNOSIS — Z3200 Encounter for pregnancy test, result unknown: Secondary | ICD-10-CM | POA: Diagnosis not present

## 2021-07-07 DIAGNOSIS — Z113 Encounter for screening for infections with a predominantly sexual mode of transmission: Secondary | ICD-10-CM | POA: Diagnosis not present

## 2021-07-07 DIAGNOSIS — Z114 Encounter for screening for human immunodeficiency virus [HIV]: Secondary | ICD-10-CM | POA: Diagnosis not present

## 2021-07-07 DIAGNOSIS — G44309 Post-traumatic headache, unspecified, not intractable: Secondary | ICD-10-CM

## 2021-07-07 DIAGNOSIS — Z3169 Encounter for other general counseling and advice on procreation: Secondary | ICD-10-CM

## 2021-07-07 DIAGNOSIS — S0990XA Unspecified injury of head, initial encounter: Secondary | ICD-10-CM | POA: Diagnosis not present

## 2021-07-07 DIAGNOSIS — N898 Other specified noninflammatory disorders of vagina: Secondary | ICD-10-CM

## 2021-07-07 DIAGNOSIS — S060X0A Concussion without loss of consciousness, initial encounter: Secondary | ICD-10-CM

## 2021-07-07 LAB — POCT URINE PREGNANCY: Preg Test, Ur: NEGATIVE

## 2021-07-07 MED ORDER — FLUCONAZOLE 150 MG PO TABS: 150.0000 mg | ORAL_TABLET | Freq: Once | ORAL | 0 refills | Status: AC

## 2021-07-07 MED ORDER — PRENATAL VITAMINS 28-0.8 MG PO TABS: 1.0000 | ORAL_TABLET | Freq: Every day | ORAL | 11 refills | Status: DC

## 2021-07-07 NOTE — Assessment & Plan Note (Signed)
Patient is sexually active, does not use birth control does not want birth control, reports she would be happy if she became pregnant.  Recommend she use prenatal vitamin, prescription sent.  Urine pregnancy today was negative.  Reviewed medication list, had to do some research on safety of dimethyl fumarate in pregancy after patient left. It is for relapsing MS.  Will send patient message that she should discuss this with her neurologist as it is not recommended in pregnancy.
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
## 2021-07-07 NOTE — Progress Notes (Signed)
    SUBJECTIVE:   CHIEF COMPLAINT / HPI:   Vaginal Discharge: Patient is a 36 y.o. female presenting with vaginal discharge for 2-3 days.  She states the discharge is of thick, white, "cottage cheese-like" consistency.  She endorses no vaginal odor.  She does have pruritus.  She is interested in screening for sexually transmitted infections today. Last pap smear 06/29/20, NILM, HPV negative. Next due for pap in June 2027.  Her last menstrual period was June 10, 2021.  She is not using any birth control, she is sexually active.  She reports she would be happy if she became pregnant.  Patient declines birth control.  Recommend prenatal vitamins.  PERTINENT  PMH / PSH: MS  OBJECTIVE:   BP 128/85   Pulse 82   Ht 5\' 1"  (1.549 m)   Wt 232 lb 6.4 oz (105.4 kg)   LMP 06/10/2021   SpO2 100%   BMI 43.91 kg/m    General: NAD, pleasant, able to participate in exam Respiratory: Normal effort, no obvious respiratory distress Pelvic: VULVA: normal appearing vulva with no masses, tenderness or lesions, VAGINA: Normal appearing vagina with normal color, no lesions, with white and thick discharge present CERVIX: No lesions, scant discharge present  ASSESSMENT/PLAN:   Vaginal discharge 36 y.o. female with vaginal discharge for 2-3 days, as well as pruritus.  Physical exam significant for thick, white discharge. Patient is interested in STI screening.   Plan: -Unable to do wet prep today in clinic, will send off Aptima swab for candidal vaginitis, BV, trichomonas -Symptoms are very consistent with yeast infection, will treat with Diflucan 150 mg x 1 dose -GC/chlamydia pending -Will check HIV and RPR  Encounter for preconception consultation Patient is sexually active, does not use birth control does not want birth control, reports she would be happy if she became pregnant.  Recommend she use prenatal vitamin, prescription sent.  Urine pregnancy today was negative.  Reviewed medication list, had to do  some research on safety of dimethyl fumarate in pregancy after patient left. It is for relapsing MS.  Will send patient message that she should discuss this with her neurologist as it is not recommended in pregnancy.   34, MD [facility]

## 2021-07-07 NOTE — Patient Instructions (Addendum)
It was a pleasure to see you today!  I recommend you take prenatal vitamins in case you become pregnant. They help the baby grow a normal spinal cord and spine. We will get some labs today.  If they are abnormal or we need to do something about them, I will call you.  If they are normal, I will send you a message on MyChart (if it is active) or a letter in the mail.  If you don't hear from [place] in 2 weeks, please call the office  [phone]. Because of your symptoms, I will treat you with diflucan today. Take 1 dose one time. It has been sent to your pharmacy  Be Well,  Dr. Leary Roca

## 2021-07-07 NOTE — Assessment & Plan Note (Signed)
36 y.o. female with vaginal discharge for 2-3 days, as well as pruritus.  Physical exam significant for thick, white discharge. Patient is interested in STI screening.   Plan: -Unable to do wet prep today in clinic, will send off Aptima swab for candidal vaginitis, BV, trichomonas -Symptoms are very consistent with yeast infection, will treat with Diflucan 150 mg x 1 dose -GC/chlamydia pending -Will check HIV and RPR

## 2021-07-08 ENCOUNTER — Encounter: Payer: Self-pay | Admitting: Family Medicine

## 2021-07-08 ENCOUNTER — Ambulatory Visit
Admission: RE | Admit: 2021-07-08 | Discharge: 2021-07-08 | Disposition: A | Discharge status: Home / Self Care | Payer: Self-pay | Source: Ambulatory Visit | Attending: Neurology | Admitting: Neurology

## 2021-07-08 DIAGNOSIS — S060X0A Concussion without loss of consciousness, initial encounter: Secondary | ICD-10-CM

## 2021-07-08 DIAGNOSIS — G44309 Post-traumatic headache, unspecified, not intractable: Secondary | ICD-10-CM | POA: Diagnosis not present

## 2021-07-08 LAB — CERVICOVAGINAL ANCILLARY ONLY
Bacterial Vaginitis (gardnerella): POSITIVE — AB
Candida Glabrata: NEGATIVE
Candida Vaginitis: POSITIVE — AB
Chlamydia: NEGATIVE
Comment: NEGATIVE
Comment: NEGATIVE
Comment: NEGATIVE
Comment: NEGATIVE
Comment: NEGATIVE
Comment: NORMAL
Neisseria Gonorrhea: NEGATIVE
Trichomonas: NEGATIVE

## 2021-07-08 LAB — HIV ANTIBODY (ROUTINE TESTING W REFLEX): HIV Screen 4th Generation wRfx: NONREACTIVE

## 2021-07-08 LAB — RPR: RPR Ser Ql: NONREACTIVE

## 2021-07-08 NOTE — Telephone Encounter (Signed)
Spoke with patient and she needs FMLA filled out sooner than 7/20.  Patient rescheduled for ATC on 7/10.
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
## 2021-07-11 ENCOUNTER — Ambulatory Visit (INDEPENDENT_AMBULATORY_CARE_PROVIDER_SITE_OTHER): Payer: No Typology Code available for payment source | Admitting: Student

## 2021-07-11 VITALS — BP 118/66 | HR 84 | Wt 230.0 lb

## 2021-07-11 DIAGNOSIS — B3731 Acute candidiasis of vulva and vagina: Secondary | ICD-10-CM

## 2021-07-11 DIAGNOSIS — N760 Acute vaginitis: Secondary | ICD-10-CM

## 2021-07-11 DIAGNOSIS — B9689 Other specified bacterial agents as the cause of diseases classified elsewhere: Secondary | ICD-10-CM

## 2021-07-11 MED ORDER — METRONIDAZOLE 500 MG PO TABS: 500.0000 mg | ORAL_TABLET | Freq: Two times a day (BID) | ORAL | 0 refills | Status: DC

## 2021-07-11 MED ORDER — FLUCONAZOLE 150 MG PO TABS: 150.0000 mg | ORAL_TABLET | Freq: Once | ORAL | 0 refills | Status: AC

## 2021-07-11 NOTE — Assessment & Plan Note (Signed)
-  Flagyl 500 mg BID for 7 days

## 2021-07-11 NOTE — Assessment & Plan Note (Addendum)
-  Mild pain in upper back likely related to muscle strain. No imaging needed as pain is acute, intermittent, well controlled with tylenol with no reproduction of pain with movement or palpation.  -Pt to return if pain worsens/persists for more than a few weeks -FMLA paper work completed for 7/5-07/10/21

## 2021-07-11 NOTE — Patient Instructions (Signed)
It was great to see you! Thank you for allowing me to participate in your care!  Our plans for today:  - I sent in another anti- yeast pill and also an antibiotic to treat bacterial vaginosis to your pharmacy - Continue to take tylenol as needed for upper back pain and return if pain worsens or persists after a few weeks.   Take care and seek immediate care sooner if you develop any concerns.   Dr. Erick Alley, DO [facility]

## 2021-07-11 NOTE — Assessment & Plan Note (Signed)
-  One dose of Diflucan 150 mg

## 2021-07-14 ENCOUNTER — Encounter: Payer: Self-pay | Admitting: Neurology

## 2021-07-14 ENCOUNTER — Other Ambulatory Visit: Payer: Self-pay | Admitting: Neurology

## 2021-07-14 ENCOUNTER — Ambulatory Visit: Payer: 59 | Admitting: Neurology

## 2021-07-14 VITALS — BP 127/81 | HR 82 | Ht 61.0 in | Wt 231.5 lb

## 2021-07-14 DIAGNOSIS — G4733 Obstructive sleep apnea (adult) (pediatric): Secondary | ICD-10-CM

## 2021-07-14 DIAGNOSIS — Z79899 Other long term (current) drug therapy: Secondary | ICD-10-CM

## 2021-07-14 DIAGNOSIS — R5383 Other fatigue: Secondary | ICD-10-CM

## 2021-07-14 DIAGNOSIS — G35 Multiple sclerosis: Secondary | ICD-10-CM

## 2021-07-14 MED ORDER — DIMETHYL FUMARATE 240 MG PO CPDR: 240.0000 mg | DELAYED_RELEASE_CAPSULE | Freq: Two times a day (BID) | ORAL | 6 refills | Status: DC

## 2021-07-14 NOTE — Telephone Encounter (Signed)
Has appt at 10am today
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
## 2021-07-15 LAB — CBC WITH DIFFERENTIAL/PLATELET
Basophils Absolute: 0 10*3/uL (ref 0.0–0.2)
Basos: 0 %
EOS (ABSOLUTE): 0.1 10*3/uL (ref 0.0–0.4)
Eos: 2 %
Hematocrit: 40.3 % (ref 34.0–46.6)
Hemoglobin: 14 g/dL (ref 11.1–15.9)
Immature Grans (Abs): 0 10*3/uL (ref 0.0–0.1)
Immature Granulocytes: 0 %
Lymphocytes Absolute: 0.7 10*3/uL (ref 0.7–3.1)
Lymphs: 13 %
MCH: 30.4 pg (ref 26.6–33.0)
MCHC: 34.7 g/dL (ref 31.5–35.7)
MCV: 87 fL (ref 79–97)
Monocytes Absolute: 0.5 10*3/uL (ref 0.1–0.9)
Monocytes: 10 %
Neutrophils Absolute: 4 10*3/uL (ref 1.4–7.0)
Neutrophils: 75 %
Platelets: 267 10*3/uL (ref 150–450)
RBC: 4.61 x10E6/uL (ref 3.77–5.28)
RDW: 12.2 % (ref 11.7–15.4)
WBC: 5.3 10*3/uL (ref 3.4–10.8)

## 2021-07-15 LAB — HEPATIC FUNCTION PANEL
ALT: 17 IU/L (ref 0–32)
AST: 16 IU/L (ref 0–40)
Albumin: 4.1 g/dL (ref 3.9–4.9)
Alkaline Phosphatase: 74 IU/L (ref 44–121)
Bilirubin Total: <0.2 mg/dL (ref 0.0–1.2)
Bilirubin, Direct: <0.1 mg/dL (ref 0.00–0.40)
Total Protein: 6.5 g/dL (ref 6.0–8.5)

## 2021-07-18 ENCOUNTER — Telehealth: Payer: Self-pay | Admitting: Neurology

## 2021-07-18 NOTE — Telephone Encounter (Signed)
90 mins MRI brain w/wo & MRI cervical w/wo Dr. Epimenio Foot [facility] NPR [phone] scheduled at [facility] 07/27/21 at 8am

## 2021-07-21 ENCOUNTER — Ambulatory Visit: Payer: 59 | Admitting: Family Medicine

## 2021-07-26 ENCOUNTER — Encounter: Payer: Self-pay | Admitting: Neurology

## 2021-07-27 ENCOUNTER — Other Ambulatory Visit: Payer: 59

## 2021-08-02 ENCOUNTER — Ambulatory Visit: Payer: 59

## 2021-08-03 ENCOUNTER — Other Ambulatory Visit: Payer: Self-pay | Admitting: *Deleted

## 2021-08-03 MED ORDER — ALPRAZOLAM 0.5 MG PO TABS: ORAL_TABLET | ORAL | 0 refills | Status: DC

## 2021-08-03 NOTE — Telephone Encounter (Signed)
Can you prescribe her something? She is getting an open MRI

## 2021-08-03 NOTE — Telephone Encounter (Signed)
SCHEDULED 8/16 1015AM

## 2021-08-03 NOTE — Telephone Encounter (Signed)
MRIs have been sent to [facility] for open MRI

## 2021-08-11 ENCOUNTER — Telehealth: Payer: Self-pay | Admitting: Family Medicine

## 2021-08-11 NOTE — Telephone Encounter (Signed)
LVM and sent mychart msg informing pt of need to reschedule 1/15 appointment - Amy out 

## 2021-08-18 ENCOUNTER — Encounter: Payer: Self-pay | Admitting: Neurology

## 2021-12-01 ENCOUNTER — Ambulatory Visit: Payer: 59 | Admitting: Neurology

## 2022-01-04 ENCOUNTER — Telehealth: Payer: 59 | Admitting: Nurse Practitioner

## 2022-01-04 DIAGNOSIS — H9209 Otalgia, unspecified ear: Secondary | ICD-10-CM | POA: Diagnosis not present

## 2022-01-04 MED ORDER — NAPROXEN 500 MG PO TABS: 500.0000 mg | ORAL_TABLET | Freq: Two times a day (BID) | ORAL | 0 refills | Status: DC

## 2022-01-04 MED ORDER — FLUTICASONE PROPIONATE 50 MCG/ACT NA SUSP: 2.0000 | Freq: Every day | NASAL | 6 refills | Status: DC

## 2022-01-04 NOTE — Progress Notes (Signed)
E-Visit for Ear Pain - Eustachian Tube Dysfunction   We are sorry that you are not feeling well. Here is how we plan to help!  Based on what you have shared with me it looks like you have Eustachian Tube Dysfunction.  Eustachian Tube Dysfunction is a condition where the tubes that connect your middle ears to your upper throat become blocked. This can lead to discomfort, hearing difficulties and a feeling of fullness in your ear. Eustachian tube dysfunction usually resolves itself in a few days. The usual symptoms include: Hearing problems Tinnitus, or ringing in your ears Clicking or popping sounds A feeling of fullness in your ears Pain that mimics an ear infection Dizziness, vertigo or balance problems A "tickling" sensation in your ears  ?Eustachian tube dysfunction symptoms may get worse in higher altitudes. This is called barotrauma, and it can happen while scuba diving, flying in an airplane or driving in the mountains.   What causes eustachian tube dysfunction? Allergies and infections (like the common cold and the flu) are the most common causes of eustachian tube dysfunction. These conditions can cause inflammation and mucus buildup, leading to blockage. GERD, or chronic acid reflux, can also cause ETD. This is because stomach acid can back up into your throat and result in inflammation. As mentioned above, altitude changes can also cause ETD.   What are some common eustachian tube dysfunction treatments? In most cases, treatment isn't necessary because ETD often resolves on its own. However, you might need treatment if your symptoms linger for more than two weeks.    Eustachian tube dysfunction treatment depends on the cause and the severity of your condition. Treatments may include home remedies, medications or, in severe cases, surgery.     HOME CARE: Sometimes simple home remedies can help with mild cases of eustachian tube dysfunction. To try and clear the blockage, you  can: Chew gum. Yawn. Swallow. Try the Valsalva maneuver (breathing out forcefully while closing your mouth and pinching your nostrils). Use a saline spray to clear out nasal passages.  MEDICATIONS: Over-the-counter medications can help if allergies are causing eustachian tube dysfunction. Try antihistamines (like cetirizine or diphenhydramine) to ease your symptoms. If you have discomfort, pain relievers -- such as acetaminophen or ibuprofen -- can help.  Sometimes intranasal glucocorticosteroids (like Flonase or Nasacort) help.  I have prescribed Fluticasone 50 mcg/spray 2 sprays in each nostril daily for 10-14 days We will also call in a medication to help with pain and inflammation. If you start to have a runny nose or more nasal congestion we would recommend over the counter cold/flu medication as sore throat and ear pain are typically related to the start of an upper respiratory viral infection.   Meds ordered this encounter  Medications   naproxen (NAPROSYN) 500 MG tablet    Sig: Take 1 tablet (500 mg total) by mouth 2 (two) times daily with a meal.    Dispense:  14 tablet    Refill:  0   fluticasone (FLONASE) 50 MCG/ACT nasal spray    Sig: Place 2 sprays into both nostrils daily.    Dispense:  16 g    Refill:  6      GET HELP RIGHT AWAY IF: Fever is over 102.2 degrees. You develop progressive ear pain or hearing loss. Ear symptoms persist longer than 3 days after treatment.  MAKE SURE YOU: Understand these instructions. Will watch your condition. Will get help right away if you are not doing well or get  worse.  Thank you for choosing an e-visit.  Your e-visit answers were reviewed by a board certified advanced clinical practitioner to complete your personal care plan. Depending upon the condition, your plan could have included both over the counter or prescription medications.  Please review your pharmacy choice. Make sure the pharmacy is open so you can pick up the  prescription now. If there is a problem, you may contact your provider through [employer] and have the prescription routed to another pharmacy.  Your safety is important to [place]. If you have drug allergies check your prescription carefully.   For the next 24 hours you can use MyChart to ask questions about today's visit, request a non-urgent call back, or ask for a work or school excuse. You will get an email with a survey after your eVisit asking about your experience. We would appreciate your feedback. I hope that your e-visit has been valuable and will aid in your recovery.  I spent approximately 7 minutes reviewing the patient's history, current symptoms and coordinating their plan of care today.

## 2022-01-08 ENCOUNTER — Other Ambulatory Visit: Payer: Self-pay | Admitting: Nurse Practitioner

## 2022-01-08 ENCOUNTER — Encounter: Payer: Self-pay | Admitting: Neurology

## 2022-01-08 DIAGNOSIS — H9209 Otalgia, unspecified ear: Secondary | ICD-10-CM

## 2022-01-12 ENCOUNTER — Other Ambulatory Visit: Payer: Self-pay | Admitting: Nurse Practitioner

## 2022-01-12 DIAGNOSIS — H9209 Otalgia, unspecified ear: Secondary | ICD-10-CM

## 2022-01-16 ENCOUNTER — Ambulatory Visit: Payer: 59 | Admitting: Neurology

## 2022-01-16 ENCOUNTER — Ambulatory Visit: Payer: 59 | Admitting: Family Medicine

## 2022-01-16 ENCOUNTER — Encounter: Payer: Self-pay | Admitting: Neurology

## 2022-01-16 VITALS — BP 144/95 | HR 87 | Ht 61.0 in | Wt 226.5 lb

## 2022-01-16 DIAGNOSIS — Z79899 Other long term (current) drug therapy: Secondary | ICD-10-CM

## 2022-01-16 DIAGNOSIS — L299 Pruritus, unspecified: Secondary | ICD-10-CM | POA: Diagnosis not present

## 2022-01-16 DIAGNOSIS — G4733 Obstructive sleep apnea (adult) (pediatric): Secondary | ICD-10-CM

## 2022-01-16 DIAGNOSIS — G35 Multiple sclerosis: Secondary | ICD-10-CM
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
## 2022-01-17 ENCOUNTER — Telehealth: Payer: Self-pay | Admitting: *Deleted

## 2022-01-17 LAB — CBC WITH DIFFERENTIAL/PLATELET
Basophils Absolute: 0 10*3/uL (ref 0.0–0.2)
Basos: 0 %
EOS (ABSOLUTE): 0.1 10*3/uL (ref 0.0–0.4)
Eos: 2 %
Hematocrit: 41 % (ref 34.0–46.6)
Hemoglobin: 13.6 g/dL (ref 11.1–15.9)
Immature Grans (Abs): 0 10*3/uL (ref 0.0–0.1)
Immature Granulocytes: 0 %
Lymphocytes Absolute: 1.4 10*3/uL (ref 0.7–3.1)
Lymphs: 20 %
MCH: 29.4 pg (ref 26.6–33.0)
MCHC: 33.2 g/dL (ref 31.5–35.7)
MCV: 89 fL (ref 79–97)
Monocytes Absolute: 0.6 10*3/uL (ref 0.1–0.9)
Monocytes: 8 %
Neutrophils Absolute: 4.8 10*3/uL (ref 1.4–7.0)
Neutrophils: 70 %
Platelets: 303 10*3/uL (ref 150–450)
RBC: 4.63 x10E6/uL (ref 3.77–5.28)
RDW: 12.7 % (ref 11.7–15.4)
WBC: 6.9 10*3/uL (ref 3.4–10.8)

## 2022-01-17 LAB — HEPATIC FUNCTION PANEL
ALT: 16 IU/L (ref 0–32)
AST: 13 IU/L (ref 0–40)
Albumin: 4.2 g/dL (ref 3.9–4.9)
Alkaline Phosphatase: 63 IU/L (ref 44–121)
Bilirubin Total: 0.3 mg/dL (ref 0.0–1.2)
Bilirubin, Direct: <0.1 mg/dL (ref 0.00–0.40)
Total Protein: 6.7 g/dL (ref 6.0–8.5)

## 2022-01-17 NOTE — Telephone Encounter (Signed)
-----  Message from Britt Bottom, MD sent at 01/17/2022  8:14 AM EST ----- The lab work was normal.  We can send in the form for [place].  She does not have a recent EKG.  Please check the box on the form for her to get an EKG and eye OCT

## 2022-01-17 NOTE — Telephone Encounter (Signed)
Called pt at [phone]. Relayed results per Dr. Garth Bigness note. Aware Zeposia 360 support will be in touch with her to set up lab draw/EKG. Once results back they will forward to [place] to clear her officially for therapy. She will call us back if she does not hear from them in the next 1-2 weeks.  Faxed completed/signed Zeposia start form to 360 support at [phone]. Received fax confirmation.

## 2022-01-19 NOTE — Telephone Encounter (Signed)
Called Zeposia 360 support at [phone]. Spoke w/ Colletta [place]. Confirmed they received start form.  Attempted to call pt yesterday to do welcome call. Have another call scheduled for tomorrow to pt.   Once they reach pt, they will schedule ECG/VZV antibody serology. Once complete, they will send to MD within 7-10 business days to review in order to clear pt for therapy.   PA is needed. Key: [id number]. Once cleared, can proceed.

## 2022-01-30 NOTE — Telephone Encounter (Addendum)
Called and spoke with Andrea/Zeposia 360 support to get update on pt. Enrollment complete. Reached out to ptx2 to do welcome call. First VM, unable to LVM d/t voicemail being full. Second message, LVM 01/25/22 to have pt call. Has not heard back from the patient. Aware we will reach out to pt.   Called pt. Asked she called Zeposia 360 support back at [phone]. She verbalized understanding.  She states she had ECG/VZV lab yesterday. Aware they will send [place] results to review.

## 2022-02-02 NOTE — Telephone Encounter (Signed)
PA needed for Zeposia. Key on covermymeds: [id number]. Thank you

## 2022-02-02 NOTE — Telephone Encounter (Signed)
Dr. Felecia Shelling reviewed ECG/VZV lab and cleared pt to start on Zeposia. I cleared pt on covermymeds to start therapy.

## 2022-02-03 ENCOUNTER — Other Ambulatory Visit (HOSPITAL_COMMUNITY): Payer: Self-pay

## 2022-02-06 NOTE — Telephone Encounter (Signed)
It appears that Julia Taylor is already working on this PA.

## 2022-02-07 ENCOUNTER — Other Ambulatory Visit (HOSPITAL_COMMUNITY): Payer: Self-pay

## 2022-02-07 ENCOUNTER — Encounter: Payer: Self-pay | Admitting: Family Medicine

## 2022-02-07 ENCOUNTER — Ambulatory Visit: Payer: 59 | Admitting: Family Medicine

## 2022-02-07 ENCOUNTER — Telehealth: Payer: Self-pay | Admitting: Neurology

## 2022-02-07 VITALS — BP 132/80 | HR 95 | Temp 99.9°F | Ht 61.0 in | Wt 225.2 lb

## 2022-02-07 DIAGNOSIS — Z3A08 8 weeks gestation of pregnancy: Secondary | ICD-10-CM

## 2022-02-07 DIAGNOSIS — Z3201 Encounter for pregnancy test, result positive: Secondary | ICD-10-CM

## 2022-02-07 DIAGNOSIS — N926 Irregular menstruation, unspecified: Secondary | ICD-10-CM | POA: Diagnosis not present

## 2022-02-07 LAB — POCT URINE PREGNANCY: Preg Test, Ur: POSITIVE — AB

## 2022-02-07 MED ORDER — DOXYLAMINE-PYRIDOXINE 10-10 MG PO TBEC: DELAYED_RELEASE_TABLET | ORAL | 0 refills | Status: DC

## 2022-02-07 NOTE — Telephone Encounter (Signed)
Pharmacy Patient Advocate Encounter   Received notification from [place] that prior authorization for Zeposia 0.92MG  capsules is required/requested.    PA submitted on 02/07/2022 to (ins) [employer]  via CoverMyMeds Key [id number] Status is pending

## 2022-02-07 NOTE — Telephone Encounter (Signed)
Julia Taylor is calling from [place] Stated she need to know when PA will be submit to the plan. Julia Taylor is requesting a call back.

## 2022-02-07 NOTE — Telephone Encounter (Signed)
Do you have an update on PA? It looks like you were in process of completing PA

## 2022-02-07 NOTE — Progress Notes (Signed)
    SUBJECTIVE:   CHIEF COMPLAINT / HPI:   Patient presents to discuss recent positive home pregnancy test. States she tested positive at home yesterday. LMP 05/3 which she is certain about. Has not been on contraception prior.Taking prenatal vitamins. Not planned but desired. Does not have any children.   Does have MS which has been controlled, no recent flares. Came off of Tecfidera which she was taking for a few years. Follows with Neurology. Supposed to start [name] which is not conducive to pregnancy.   Denies taking any other medications   Jan BP 144/95 BP today 132/80  Follows with Dr. Lisbeth Renshaw OB  Experiencing nausea started this weekend. When moving it worsens. [place] food made it worse.    PERTINENT  PMH / PSH: Reviewed   OBJECTIVE:   BP 132/80   Pulse 95   Temp 99.9 F (37.7 C)   Ht 5\' 1"  (1.549 m)   Wt 225 lb 3.2 oz (102.2 kg)   LMP 12/09/2021   SpO2 98%   BMI 42.55 kg/m    Physical exam General: well appearing, NAD Cardiovascular: RRR, no murmurs Lungs: CTAB. Normal WOB Abdomen: soft, non-distended, non-tender Skin: warm, dry. No edema  ASSESSMENT/PLAN:   Pregnancy test performed, pregnancy confirmed Confirmed urine pregnancy today in clinic. LMP 12/8. Does have MS which has been well controlled. Follows with neurology who is supposed to be starting her on Zeposia which looks to be contraindicated in pregnancy. Recommended talking to him about that. Denies taking any other medication other than prenatal vitamin. BP elevated in January and mildly elevated today. Will need to monitor. Patient should start on ASA for PreE ppx at 12 weeks. Follows with OB gyn Dr. Benjie Karvonen and advised her to schedule appointment to start prenatal care with her but we are happy to care for her in the meantime. Will obtain blood work. Will also prescribe Diclegis for nausea. Recommended scheduling Initial OB appointment with [place] in case she can not get in with Dr. Benjie Karvonen but to cancel it if  so. Return precautions discussed.    [place]

## 2022-02-07 NOTE — Patient Instructions (Addendum)
It was great seeing you today!  Congratulations on your pregnancy!  Continue taking your prenatal vitamin, but hold on your MS medication.  Please call your neurologist to discuss any alternative medication.  I have sent in medication for nausea.   We will do your blood draw today, but I do recommend contacting Dr. Lisbeth Renshaw to get scheduled with her as soon as possible.    Please stop by the front desk on your way out and schedule a follow up visit in 4 weeks in case you are not able to get in with Dr. Lisbeth Renshaw, but if you need to be seen earlier than that for any new issues we're happy to fit you in, just give [place] a call!  Visit Reminders: - Stop by the pharmacy to pick up your prescriptions  - Continue to work on your healthy eating habits and incorporating exercise into your daily life.   Feel free to call with any questions or concerns at any time, at [phone].   Take care,  Dr. Shary Key [facility]  Prenatal Care Prenatal care is health care during pregnancy. It helps you and your unborn baby (fetus) stay as healthy as possible. Prenatal care may be provided by a midwife, a family practice doctor, a IT consultant (nurse practitioner or physician assistant), or a childbirth and pregnancy doctor (obstetrician). How does this affect me? During pregnancy, you will be closely monitored for any new conditions that might develop. To lower your risk of pregnancy complications, you and your health care provider will talk about any underlying conditions you have. How does this affect my baby? Early and consistent prenatal care increases the chance that your baby will be healthy during pregnancy. Prenatal care lowers the risk that your baby will be: Born early (prematurely). Smaller than expected at birth (small for gestational age). What can I expect at the first prenatal care visit? Your first prenatal care visit will likely be the longest. You should  schedule your first prenatal care visit as soon as you know that you are pregnant. Your first visit is a good time to talk about any questions or concerns you have about pregnancy. Medical history At your visit, you and your health care provider will talk about your medical history, including: Any past pregnancies. Your family's medical history. Medical history of the baby's father. Any long-term (chronic) health conditions you have and how you manage them. Any surgeries or procedures you have had. Any current over-the-counter or prescription medicines, herbs, or supplements that you are taking. Other factors that could pose a risk to your baby, including: Exposure to harmful chemicals or radiation at work or at home. Any substance use, including tobacco, alcohol, and drug use. Your home setting and your stress levels, including: Exposure to abuse or violence. Household financial strain. Your daily health habits, including diet and exercise. Tests and screenings Your health care provider will: Measure your weight, height, and blood pressure. Do a physical exam, including a pelvic and breast exam. Perform blood tests and urine tests to check for: Urinary tract infection. Sexually transmitted infections (STIs). Low iron levels in your blood (anemia). Blood type and certain proteins on red blood cells (Rh antibodies). Infections and immunity to viruses, such as hepatitis B and rubella. HIV (human immunodeficiency virus). Discuss your options for genetic screening. Tips about staying healthy Your health care provider will also give you information about how to keep yourself and your baby healthy, including: Nutrition and taking vitamins.  Physical activity. How to manage pregnancy symptoms such as nausea and vomiting (morning sickness). Infections and substances that may be harmful to your baby and how to avoid them. Food safety. Dental care. Working. Travel. Warning signs to watch  for and when to call your health care provider. How often will I have prenatal care visits? After your first prenatal care visit, you will have regular visits throughout your pregnancy. The visit schedule is often as follows: Up to week 28 of pregnancy: once every 4 weeks. 28-36 weeks: once every 2 weeks. After 36 weeks: every week until delivery. Some women may have visits more or less often depending on any underlying health conditions and the health of the baby. Keep all follow-up and prenatal care visits. This is important. What happens during routine prenatal care visits? Your health care provider will: Measure your weight and blood pressure. Check for fetal heart sounds. Measure the height of your uterus in your abdomen (fundal height). This may be measured starting around week 20 of pregnancy. Check the position of your baby inside your uterus. Ask questions about your diet, sleeping patterns, and whether you can feel the baby move. Review warning signs to watch for and signs of labor. Ask about any pregnancy symptoms you are having and how you are dealing with them. Symptoms may include: Headaches. Nausea and vomiting. Vaginal discharge. Swelling. Fatigue. Constipation. Changes in your vision. Feeling persistently sad or anxious. Any discomfort, including back or pelvic pain. Bleeding or spotting. Make a list of questions to ask your health care provider at your routine visits. What tests might I have during prenatal care visits? You may have blood, urine, and imaging tests throughout your pregnancy, such as: Urine tests to check for glucose, protein, or signs of infection. Glucose tests to check for a form of diabetes that can develop during pregnancy (gestational diabetes mellitus). This is usually done around week 24 of pregnancy. Ultrasounds to check your baby's growth and development, to check for birth defects, and to check your baby's well-being. These can also help to  decide when you should deliver your baby. A test to check for group B strep (GBS) infection. This is usually done around week 36 of pregnancy. Genetic testing. This may include blood, fluid, or tissue sampling, or imaging tests, such as an ultrasound. Some genetic tests are done during the first trimester and some are done during the second trimester. What else can I expect during prenatal care visits? Your health care provider may recommend getting certain vaccines during pregnancy. These may include: A yearly flu shot (annual influenza vaccine). This is especially important if you will be pregnant during flu season. Tdap (tetanus, diphtheria, pertussis) vaccine. Getting this vaccine during pregnancy can protect your baby from whooping cough (pertussis) after birth. This vaccine may be recommended between weeks 27 and 36 of pregnancy. A COVID-19 vaccine. Later in your pregnancy, your health care provider may give you information about: Childbirth and breastfeeding classes. Choosing a health care provider for your baby. Umbilical cord banking. Breastfeeding. Birth control after your baby is born. The hospital labor and delivery unit and how to set up a tour. Registering at the hospital before you go into labor. Where to find more information Office on Women's Health: LegalWarrants.gl American Pregnancy Association: americanpregnancy.org March of Dimes: marchofdimes.org Summary Prenatal care helps you and your baby stay as healthy as possible during pregnancy. Your first prenatal care visit will most likely be the longest. You will have visits and tests  throughout your pregnancy to monitor your health and your baby's health. Bring a list of questions to your visits to ask your health care provider. Make sure to keep all follow-up and prenatal care visits. This information is not intended to replace advice given to you by your health care provider. Make sure you discuss any questions you have  with your health care provider. Document Revised: 10/02/2019 Document Reviewed: 10/02/2019 Elsevier Patient Education  [place].

## 2022-02-08 ENCOUNTER — Encounter: Payer: Self-pay | Admitting: Student

## 2022-02-08 NOTE — Telephone Encounter (Signed)
PA for [name] was approved: "Request Reference Number: [id number]. [place] CAP .92MG  is approved through 02/08/2023. Your patient may now fill this prescription and it will be covered."

## 2022-02-08 NOTE — Telephone Encounter (Signed)
Please see message from today 02/08/2022

## 2022-02-09 ENCOUNTER — Telehealth: Payer: Self-pay | Admitting: Family Medicine

## 2022-02-09 LAB — CBC/D/PLT+RPR+RH+ABO+RUBIGG...
Antibody Screen: NEGATIVE
Basophils Absolute: 0 10*3/uL (ref 0.0–0.2)
Basos: 0 %
EOS (ABSOLUTE): 0.1 10*3/uL (ref 0.0–0.4)
Eos: 1 %
HCV Ab: NONREACTIVE
HIV Screen 4th Generation wRfx: NONREACTIVE
Hematocrit: 40.9 % (ref 34.0–46.6)
Hemoglobin: 13.9 g/dL (ref 11.1–15.9)
Hepatitis B Surface Ag: NEGATIVE
Immature Grans (Abs): 0 10*3/uL (ref 0.0–0.1)
Immature Granulocytes: 0 %
Lymphocytes Absolute: 1 10*3/uL (ref 0.7–3.1)
Lymphs: 13 %
MCH: 29.7 pg (ref 26.6–33.0)
MCHC: 34 g/dL (ref 31.5–35.7)
MCV: 87 fL (ref 79–97)
Monocytes Absolute: 0.6 10*3/uL (ref 0.1–0.9)
Monocytes: 8 %
Neutrophils Absolute: 6.1 10*3/uL (ref 1.4–7.0)
Neutrophils: 78 %
Platelets: 318 10*3/uL (ref 150–450)
RBC: 4.68 x10E6/uL (ref 3.77–5.28)
RDW: 12.8 % (ref 11.7–15.4)
RPR Ser Ql: NONREACTIVE
Rh Factor: POSITIVE
Rubella Antibodies, IGG: 7.33 index (ref >0.99)
WBC: 7.8 10*3/uL (ref 3.4–10.8)

## 2022-02-09 LAB — HGB FRACTIONATION CASCADE
Hgb A2: 3.3 % — ABNORMAL HIGH (ref 1.8–3.2)
Hgb A: 96.7 % (ref 96.4–98.8)
Hgb F: 0 % (ref 0.0–2.0)
Hgb S: 0 %

## 2022-02-09 LAB — HCV INTERPRETATION

## 2022-02-09 NOTE — Telephone Encounter (Signed)
PA submitted via covermymeds. Will await response.   Aadith Raudenbush C Shaquela Weichert, RN  

## 2022-02-09 NOTE — Telephone Encounter (Signed)
Patient came in stating that she went to the pharmacy to pick up her Doxylamine-Pyridoxine 10-10 MG TBEC, but they were unable to fill it because they need a pre-authorization

## 2022-02-09 NOTE — Telephone Encounter (Signed)
Rosann Auerbach- have you received a fax for this PA?  Talbot Grumbling, RN

## 2022-02-09 NOTE — Telephone Encounter (Signed)
PA denied.    Will forward to prescriber for next steps.   Talbot Grumbling, RN

## 2022-02-10 ENCOUNTER — Other Ambulatory Visit: Payer: Self-pay | Admitting: Family Medicine

## 2022-02-10 DIAGNOSIS — Z3201 Encounter for pregnancy test, result positive: Secondary | ICD-10-CM | POA: Insufficient documentation

## 2022-02-10 MED ORDER — VITAMIN B-6 25 MG PO TABS: 25.0000 mg | ORAL_TABLET | Freq: Every day | ORAL | 0 refills | Status: DC

## 2022-02-10 MED ORDER — DOXYLAMINE SUCCINATE (SLEEP) 25 MG PO TABS: 25.0000 mg | ORAL_TABLET | Freq: Every evening | ORAL | 0 refills | Status: DC | PRN

## 2022-02-10 NOTE — Assessment & Plan Note (Signed)
Confirmed urine pregnancy today in clinic. LMP 12/8. Does have MS which has been well controlled. Follows with neurology who is supposed to be starting her on Zeposia which looks to be contraindicated in pregnancy. Recommended talking to him about that. Denies taking any other medication other than prenatal vitamin. BP elevated in January and mildly elevated today. Will need to monitor. Patient should start on ASA for PreE ppx at 12 weeks. Follows with OB gyn Dr. Benjie Karvonen and advised her to schedule appointment to start prenatal care with her but we are happy to care for her in the meantime. Will obtain blood work. Will also prescribe Diclegis for nausea. Recommended scheduling Initial OB appointment with [place] in case she can not get in with Dr. Benjie Karvonen but to cancel it if so. Return precautions discussed.

## 2022-02-15 NOTE — Telephone Encounter (Signed)
Pharmacy Patient Advocate Encounter  Prior Authorization for [facility] CAP .92MG has been approved.    PA#  [id number] Effective dates: 02/07/2022 through 02/08/2023

## 2022-02-15 NOTE — Telephone Encounter (Signed)
I called [employer] at [phone] and informed them PA was approved, they had approval dates on file electronically as well.

## 2022-02-27 ENCOUNTER — Encounter: Payer: Self-pay | Admitting: Student

## 2022-02-28 NOTE — Telephone Encounter (Signed)
Called patient to discuss.   She reports she just started taking B6 and Unisom last night. She reports "so far" she has not had any symptoms of nausea.   Patient to continue both medications and to reach out if nausea reoccurs.

## 2022-03-01 LAB — OB RESULTS CONSOLE GC/CHLAMYDIA
Chlamydia: NEGATIVE
Neisseria Gonorrhea: NEGATIVE

## 2022-03-10 ENCOUNTER — Ambulatory Visit: Payer: 59 | Admitting: Family Medicine

## 2022-05-27 ENCOUNTER — Encounter (HOSPITAL_COMMUNITY): Payer: Self-pay | Admitting: *Deleted

## 2022-05-27 ENCOUNTER — Inpatient Hospital Stay (HOSPITAL_COMMUNITY)
Admission: AD | Admit: 2022-05-27 | Discharge: 2022-05-28 | Disposition: A | Discharge status: Home / Self Care | Payer: 59 | Attending: Obstetrics and Gynecology | Admitting: Obstetrics and Gynecology

## 2022-05-27 DIAGNOSIS — Z3A23 23 weeks gestation of pregnancy: Secondary | ICD-10-CM | POA: Insufficient documentation

## 2022-05-27 DIAGNOSIS — O26892 Other specified pregnancy related conditions, second trimester: Secondary | ICD-10-CM | POA: Insufficient documentation

## 2022-05-27 DIAGNOSIS — K8020 Calculus of gallbladder without cholecystitis without obstruction: Secondary | ICD-10-CM | POA: Insufficient documentation

## 2022-05-27 DIAGNOSIS — O26612 Liver and biliary tract disorders in pregnancy, second trimester: Secondary | ICD-10-CM | POA: Insufficient documentation

## 2022-05-27 NOTE — MAU Note (Addendum)
.  Julia Taylor is a 37 y.o. at [redacted]w[redacted]d here in MAU reporting sudden onset of upper abdominal pain about 2130. Pt was lying down when the pain started. Pain was a 10 and then pt vomited but pain did not subside. No diarrhea. By the time her sister came and brought her to the hospital her pain is gone now. Denies VB or LOF. Has not felt FM yet  Onset of complaint: 2130 Pain score: 0 Vitals:   05/27/22 2330 05/27/22 2332  BP:  126/65  Pulse: 83   Resp: 17   Temp: (!) 97.4 F (36.3 C)   SpO2: 100%      FHT:156 Lab orders placed from triage:

## 2022-05-28 ENCOUNTER — Inpatient Hospital Stay (HOSPITAL_COMMUNITY): Payer: 59

## 2022-05-28 ENCOUNTER — Encounter (HOSPITAL_COMMUNITY): Payer: Self-pay | Admitting: Obstetrics and Gynecology

## 2022-05-28 DIAGNOSIS — O26892 Other specified pregnancy related conditions, second trimester: Secondary | ICD-10-CM | POA: Diagnosis not present

## 2022-05-28 DIAGNOSIS — K8020 Calculus of gallbladder without cholecystitis without obstruction: Secondary | ICD-10-CM | POA: Diagnosis not present

## 2022-05-28 DIAGNOSIS — Z3A23 23 weeks gestation of pregnancy: Secondary | ICD-10-CM | POA: Diagnosis not present

## 2022-05-28 DIAGNOSIS — O26612 Liver and biliary tract disorders in pregnancy, second trimester: Secondary | ICD-10-CM | POA: Diagnosis not present

## 2022-05-28 LAB — CBC
HCT: 36.4 % (ref 36.0–46.0)
Hemoglobin: 12.5 g/dL (ref 12.0–15.0)
MCH: 30.6 pg (ref 26.0–34.0)
MCHC: 34.3 g/dL (ref 30.0–36.0)
MCV: 89.2 fL (ref 80.0–100.0)
Platelets: 280 10*3/uL (ref 150–400)
RBC: 4.08 MIL/uL (ref 3.87–5.11)
RDW: 13.3 % (ref 11.5–15.5)
WBC: 11.2 10*3/uL — ABNORMAL HIGH (ref 4.0–10.5)
nRBC: 0 % (ref 0.0–0.2)

## 2022-05-28 LAB — COMPREHENSIVE METABOLIC PANEL
ALT: 28 U/L (ref 0–44)
AST: 49 U/L — ABNORMAL HIGH (ref 15–41)
Albumin: 3.1 g/dL — ABNORMAL LOW (ref 3.5–5.0)
Alkaline Phosphatase: 89 U/L (ref 38–126)
Anion gap: 9 (ref 5–15)
BUN: 10 mg/dL (ref 6–20)
CO2: 21 mmol/L — ABNORMAL LOW (ref 22–32)
Calcium: 10.4 mg/dL — ABNORMAL HIGH (ref 8.9–10.3)
Chloride: 107 mmol/L (ref 98–111)
Creatinine, Ser: 0.67 mg/dL (ref 0.44–1.00)
GFR, Estimated: >60 mL/min (ref >60)
Glucose, Bld: 100 mg/dL — ABNORMAL HIGH (ref 70–99)
Potassium: 3.6 mmol/L (ref 3.5–5.1)
Sodium: 137 mmol/L (ref 135–145)
Total Bilirubin: 0.6 mg/dL (ref 0.3–1.2)
Total Protein: 6.8 g/dL (ref 6.5–8.1)

## 2022-05-28 LAB — LIPASE, BLOOD: Lipase: 37 U/L (ref 11–51)

## 2022-05-28 LAB — AMYLASE: Amylase: 80 U/L (ref 28–100)
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
## 2022-05-28 NOTE — Progress Notes (Signed)
Written and verbal d/c instructions given and understanding voiced. 

## 2022-05-28 NOTE — Progress Notes (Signed)
Julia Horn NP removed pt from [facility]

## 2022-06-28 ENCOUNTER — Ambulatory Visit: Payer: 59 | Admitting: Neurology

## 2022-08-21 LAB — OB RESULTS CONSOLE GBS: GBS: POSITIVE

## 2022-09-12 ENCOUNTER — Telehealth (HOSPITAL_COMMUNITY): Payer: Self-pay | Admitting: *Deleted

## 2022-09-12 ENCOUNTER — Encounter (HOSPITAL_COMMUNITY): Payer: Self-pay | Admitting: *Deleted

## 2022-09-12 NOTE — Telephone Encounter (Signed)
Preadmission screen  

## 2022-09-14 ENCOUNTER — Inpatient Hospital Stay (HOSPITAL_COMMUNITY)
Admission: AD | Admit: 2022-09-14 | Discharge: 2022-09-14 | Disposition: A | Discharge status: Home / Self Care | Payer: 59 | Attending: Obstetrics and Gynecology | Admitting: Obstetrics and Gynecology

## 2022-09-14 ENCOUNTER — Encounter (HOSPITAL_COMMUNITY): Payer: Self-pay | Admitting: Obstetrics and Gynecology

## 2022-09-14 DIAGNOSIS — Z3689 Encounter for other specified antenatal screening: Secondary | ICD-10-CM

## 2022-09-14 DIAGNOSIS — Z3A38 38 weeks gestation of pregnancy: Secondary | ICD-10-CM | POA: Diagnosis not present

## 2022-09-14 DIAGNOSIS — O368130 Decreased fetal movements, third trimester, not applicable or unspecified: Secondary | ICD-10-CM | POA: Insufficient documentation

## 2022-09-14 NOTE — Progress Notes (Signed)
Written and verbal d/c instructions given and understanding voiced. 
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
## 2022-09-14 NOTE — MAU Note (Signed)
.  Julia Taylor is a 37 y.o. at [redacted]w[redacted]d here in MAU reporting decreased FM today. Has only felt FM 2 times since this morning. Denies pain, VB, or LOF  Onset of complaint: today Pain score: 0 Vitals:   09/14/22 2135 09/14/22 2138  BP:  128/80  Pulse: 92   Resp: 18   Temp: 98.6 F (37 C)   SpO2: 99%      FHT:144 Lab orders placed from triage:  none

## 2022-09-19 ENCOUNTER — Inpatient Hospital Stay (HOSPITAL_COMMUNITY): Payer: 59

## 2022-09-21 ENCOUNTER — Telehealth (HOSPITAL_COMMUNITY): Payer: Self-pay | Admitting: *Deleted

## 2022-09-21 ENCOUNTER — Other Ambulatory Visit: Payer: Self-pay | Admitting: Obstetrics & Gynecology

## 2022-09-21 NOTE — Telephone Encounter (Signed)
Preadmission screen  

## 2022-09-26 ENCOUNTER — Inpatient Hospital Stay (HOSPITAL_COMMUNITY): Payer: 59 | Admitting: Anesthesiology

## 2022-09-26 ENCOUNTER — Other Ambulatory Visit: Payer: Self-pay

## 2022-09-26 ENCOUNTER — Inpatient Hospital Stay (HOSPITAL_COMMUNITY)
Admission: RE | Admit: 2022-09-26 | Discharge: 2022-09-29 | DRG: 787 | Disposition: A | Discharge status: Home / Self Care | Payer: 59 | Attending: Obstetrics & Gynecology | Admitting: Obstetrics & Gynecology

## 2022-09-26 ENCOUNTER — Inpatient Hospital Stay (HOSPITAL_COMMUNITY): Payer: 59

## 2022-09-26 ENCOUNTER — Encounter (HOSPITAL_COMMUNITY): Payer: Self-pay | Admitting: Obstetrics & Gynecology

## 2022-09-26 DIAGNOSIS — O99824 Streptococcus B carrier state complicating childbirth: Secondary | ICD-10-CM | POA: Diagnosis present

## 2022-09-26 DIAGNOSIS — Z3A40 40 weeks gestation of pregnancy: Secondary | ICD-10-CM | POA: Diagnosis not present

## 2022-09-26 DIAGNOSIS — O480 Post-term pregnancy: Secondary | ICD-10-CM | POA: Diagnosis present

## 2022-09-26 DIAGNOSIS — D259 Leiomyoma of uterus, unspecified: Secondary | ICD-10-CM | POA: Diagnosis present

## 2022-09-26 DIAGNOSIS — G35 Multiple sclerosis: Secondary | ICD-10-CM | POA: Diagnosis present

## 2022-09-26 DIAGNOSIS — O365930 Maternal care for other known or suspected poor fetal growth, third trimester, not applicable or unspecified: Secondary | ICD-10-CM | POA: Diagnosis present

## 2022-09-26 DIAGNOSIS — O339 Maternal care for disproportion, unspecified: Secondary | ICD-10-CM | POA: Diagnosis present

## 2022-09-26 DIAGNOSIS — Z3A00 Weeks of gestation of pregnancy not specified: Secondary | ICD-10-CM | POA: Diagnosis not present

## 2022-09-26 DIAGNOSIS — O99892 Other specified diseases and conditions complicating childbirth: Secondary | ICD-10-CM | POA: Diagnosis present

## 2022-09-26 DIAGNOSIS — Z3490 Encounter for supervision of normal pregnancy, unspecified, unspecified trimester: Principal | ICD-10-CM | POA: Diagnosis present

## 2022-09-26 DIAGNOSIS — O3413 Maternal care for benign tumor of corpus uteri, third trimester: Secondary | ICD-10-CM | POA: Diagnosis present

## 2022-09-26 DIAGNOSIS — Z833 Family history of diabetes mellitus: Secondary | ICD-10-CM

## 2022-09-26 DIAGNOSIS — O99354 Diseases of the nervous system complicating childbirth: Secondary | ICD-10-CM | POA: Diagnosis present

## 2022-09-26 DIAGNOSIS — O99214 Obesity complicating childbirth: Secondary | ICD-10-CM | POA: Diagnosis present

## 2022-09-26 LAB — CBC
HCT: 38.4 % (ref 36.0–46.0)
Hemoglobin: 13.1 g/dL (ref 12.0–15.0)
MCH: 29.5 pg (ref 26.0–34.0)
MCHC: 34.1 g/dL (ref 30.0–36.0)
MCV: 86.5 fL (ref 80.0–100.0)
Platelets: 292 10*3/uL (ref 150–400)
RBC: 4.44 MIL/uL (ref 3.87–5.11)
RDW: 14.6 % (ref 11.5–15.5)
WBC: 7.8 10*3/uL (ref 4.0–10.5)
nRBC: 0 % (ref 0.0–0.2)

## 2022-09-26 LAB — TYPE AND SCREEN
ABO/RH(D): B POS
Antibody Screen: NEGATIVE

## 2022-09-26 LAB — RPR: RPR Ser Ql: NONREACTIVE

## 2022-09-26 MED ORDER — LIDOCAINE HCL (PF) 1 % IJ SOLN: 30.0000 mL | INTRAMUSCULAR | Status: DC | PRN

## 2022-09-26 MED ORDER — TERBUTALINE SULFATE 1 MG/ML IJ SOLN: 0.2500 mg | Freq: Once | INTRAMUSCULAR | Status: DC | PRN

## 2022-09-26 MED ORDER — ONDANSETRON HCL 4 MG/2ML IJ SOLN: 4.0000 mg | Freq: Four times a day (QID) | INTRAMUSCULAR | Status: DC | PRN

## 2022-09-26 MED ORDER — EPHEDRINE 5 MG/ML INJ: 10.0000 mg | INTRAVENOUS | Status: DC | PRN

## 2022-09-26 MED ORDER — SODIUM CHLORIDE 0.9 % IV SOLN
INTRAVENOUS | Status: DC | PRN
  Administered 2022-09-26: 10 mL via EPIDURAL

## 2022-09-26 MED ORDER — SOD CITRATE-CITRIC ACID 500-334 MG/5ML PO SOLN
30.0000 mL | ORAL | Status: DC | PRN
  Administered 2022-09-27: 30 mL via ORAL
  Filled 2022-09-26: qty 30

## 2022-09-26 MED ORDER — TERBUTALINE SULFATE 1 MG/ML IJ SOLN
0.2500 mg | Freq: Once | INTRAMUSCULAR | Status: DC | PRN
  Filled 2022-09-26: qty 1

## 2022-09-26 MED ORDER — ACETAMINOPHEN 325 MG PO TABS: 650.0000 mg | ORAL_TABLET | ORAL | Status: DC | PRN

## 2022-09-26 MED ORDER — PHENYLEPHRINE 80 MCG/ML (10ML) SYRINGE FOR IV PUSH (FOR BLOOD PRESSURE SUPPORT): 80.0000 ug | PREFILLED_SYRINGE | INTRAVENOUS | Status: DC | PRN

## 2022-09-26 MED ORDER — FENTANYL CITRATE (PF) 100 MCG/2ML IJ SOLN
INTRAMUSCULAR | Status: AC
  Filled 2022-09-26: qty 2

## 2022-09-26 MED ORDER — DIPHENHYDRAMINE HCL 50 MG/ML IJ SOLN: 12.5000 mg | INTRAMUSCULAR | Status: DC | PRN

## 2022-09-26 MED ORDER — FENTANYL CITRATE (PF) 100 MCG/2ML IJ SOLN
50.0000 ug | INTRAMUSCULAR | Status: DC | PRN
  Administered 2022-09-26 (×5): 50 ug via INTRAVENOUS
  Filled 2022-09-26 (×4): qty 2

## 2022-09-26 MED ORDER — FENTANYL-BUPIVACAINE-NACL 0.5-0.125-0.9 MG/250ML-% EP SOLN
12.0000 mL/h | EPIDURAL | Status: DC | PRN
  Administered 2022-09-26: 12 mL/h via EPIDURAL
  Filled 2022-09-26: qty 250

## 2022-09-26 MED ORDER — OXYTOCIN-SODIUM CHLORIDE 30-0.9 UT/500ML-% IV SOLN
1.0000 m[IU]/min | INTRAVENOUS | Status: DC
  Administered 2022-09-26: 1 m[IU]/min via INTRAVENOUS
  Filled 2022-09-26: qty 500

## 2022-09-26 MED ORDER — LACTATED RINGERS IV SOLN
500.0000 mL | Freq: Once | INTRAVENOUS | Status: AC
  Administered 2022-09-26: 500 mL via INTRAVENOUS

## 2022-09-26 MED ORDER — LACTATED RINGERS IV SOLN
500.0000 mL | INTRAVENOUS | Status: DC | PRN
  Administered 2022-09-26 (×2): 500 mL via INTRAVENOUS

## 2022-09-26 MED ORDER — OXYTOCIN-SODIUM CHLORIDE 30-0.9 UT/500ML-% IV SOLN: 2.5000 [IU]/h | INTRAVENOUS | Status: DC

## 2022-09-26 MED ORDER — PENICILLIN G POT IN DEXTROSE 60000 UNIT/ML IV SOLN
3.0000 10*6.[IU] | INTRAVENOUS | Status: DC
  Administered 2022-09-26 (×4): 3 10*6.[IU] via INTRAVENOUS
  Filled 2022-09-26 (×5): qty 50

## 2022-09-26 MED ORDER — MISOPROSTOL 25 MCG QUARTER TABLET
25.0000 ug | ORAL_TABLET | ORAL | Status: DC | PRN
  Administered 2022-09-26: 25 ug via VAGINAL
  Filled 2022-09-26: qty 1

## 2022-09-26 MED ORDER — LACTATED RINGERS IV SOLN: INTRAVENOUS | Status: DC

## 2022-09-26 MED ORDER — OXYTOCIN BOLUS FROM INFUSION: 333.0000 mL | Freq: Once | INTRAVENOUS | Status: DC

## 2022-09-26 MED ORDER — LIDOCAINE HCL (PF) 1 % IJ SOLN
INTRAMUSCULAR | Status: DC | PRN
  Administered 2022-09-26 (×2): 4 mL via EPIDURAL

## 2022-09-26 MED ORDER — SODIUM CHLORIDE 0.9 % IV SOLN
5.0000 10*6.[IU] | Freq: Once | INTRAVENOUS | Status: AC
  Administered 2022-09-26: 5 10*6.[IU] via INTRAVENOUS
  Filled 2022-09-26: qty 5
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
## 2022-09-26 NOTE — Progress Notes (Signed)
Julia Taylor is a 37 y.o. G1P0000 at [redacted]w[redacted]d sono. Here for IOL for AMA, post dates with poor fetal growth in 3tr but improved to AGA.   BP 131/80   Pulse 77   Temp 98.1 F (36.7 C) (Axillary)   Resp 16   Ht 5\' 1"  (1.549 m)   Wt 103.8 kg   LMP 12/09/2021   SpO2 97%   BMI 43.25 kg/m  No intake/output data recorded. No intake/output data recorded.  FHT:  FHR: 135 bpm, variability: moderate,  accelerations:  Present,  decelerations:  Absent (prior rare variable decel noted) UC:   irreg, pitocin,  SVE:   Dilation: 1 Effacement (%): 50 Station: -3 Exam by:: Fredia Beets, RN Plan cervical balloon at next check by MD   Labs: Lab Results  Component Value Date   WBC 7.8 09/26/2022   HGB 13.1 09/26/2022   HCT 38.4 09/26/2022   MCV 86.5 09/26/2022   PLT 292 09/26/2022    Assessment / Plan: IOL, latent phase, low dose pitocin. Cytotec #2 held due to intermittent variables and few late decels early on.  Fetal Wellbeing:  Category I Pain Control:  IV pain meds I/D:   POS GBs PCN per protocol Anticipated MOD:  NSVD plan.   Robley Fries, MD 09/26/2022, 1:43 PM

## 2022-09-26 NOTE — Progress Notes (Signed)
Julia Taylor Subjective: Pain in suprapubic area with contractions. Epidural bolus x 2 and Anesth MD saw her as well.  Pain w/ UCs only specific in suprapubic area, overall better w/ epidural   Objective: BP (!) 147/78   Pulse 83   Temp 98.8 F (37.1 C) (Oral)   Resp 19   Ht 5\' 1"  (1.549 m)   Wt 103.8 kg   LMP 12/09/2021   SpO2 99%   BMI 43.25 kg/m   FHT:  FHR: 140 bpm, variability: moderate,  accelerations:  Present,  decelerations:  Present repetitive variable decels noted when I arrived and RN had been reporting to me as well but mod variability in b/w and decels <1 min long- overall II  UC:  a 3 min, pitocin at 3 mu  SVE:   4/50%/-2, Vx, caput Cervix swollen and thick compared to really well effaced soft last check. Head well applied w/ caput   Assessment / Plan: IOL for dates. Started well but now arrest of dilation and likely CPD with epidural working all over except suprapubic area Pt was given the choice to stop pitocin and rest for 30 min, will allow baby to recover and we can reassess if restarting pitocin and allowing contractions can be an option Pt was advised she is not dilating but it is still latent phase and can take longer to dilate She wanted to rest and see,   Robley Fries, MD 09/26/2022, 11:56 PM

## 2022-09-26 NOTE — Anesthesia Procedure Notes (Addendum)
Epidural Patient location during procedure: OB Start time: 09/26/2022 6:25 PM End time: 09/26/2022 6:32 PM  Staffing Anesthesiologist: Beryle Lathe, MD Performed: anesthesiologist   Preanesthetic Checklist Completed: patient identified, IV checked, risks and benefits discussed, monitors and equipment checked, pre-op evaluation and timeout performed  Epidural Patient position: sitting Prep: DuraPrep Patient monitoring: continuous pulse ox and blood pressure Approach: midline Location: L4-L5 Injection technique: LOR saline  Needle:  Needle type: Tuohy  Needle gauge: 17 G Needle length: 9 cm Needle insertion depth: 9 cm Catheter size: 19 Gauge Catheter at skin depth: 14 cm Test dose: negative and Other (1% lidocaine)  Assessment Events: blood not aspirated and no cerebrospinal fluid  Additional Notes Patient identified. Risks including, but not limited to, bleeding, infection, nerve damage, paralysis, inadequate analgesia, blood pressure changes, nausea, vomiting, allergic reaction, postpartum back pain, itching, and headache were discussed. Patient expressed understanding and wished to proceed. Sterile prep and drape, including hand hygiene, mask, and sterile gloves were used. The patient was positioned and the spine was prepped. The skin was anesthetized with lidocaine. No paraesthesia or other complication noted. The patient did not experience any signs of intravascular injection such as tinnitus or metallic taste in mouth, nor signs of intrathecal spread such as rapid motor block. Please see nursing notes for vital signs. The patient tolerated the procedure well.   Leslye Peer, MDReason for block:procedure for pain

## 2022-09-26 NOTE — Anesthesia Preprocedure Evaluation (Addendum)
Anesthesia Evaluation  Patient identified by MRN, date of birth, ID band Patient awake    Reviewed: Allergy & Precautions, NPO status , Patient's Chart, lab work & pertinent test results  Airway Mallampati: II   Neck ROM: Full    Dental  (+) Teeth Intact   Pulmonary neg pulmonary ROS   Pulmonary exam normal        Cardiovascular negative cardio ROS Normal cardiovascular exam     Neuro/Psych  Headaches, Seizures -, Well Controlled,   Multiple sclerosis     GI/Hepatic negative GI ROS, Neg liver ROS,,,  Endo/Other  negative endocrine ROS  Morbid obesity  Renal/GU negative Renal ROS     Musculoskeletal negative musculoskeletal ROS (+)    Abdominal  (+) + obese  Peds  Hematology  Plt 292k    Anesthesia Other Findings   Reproductive/Obstetrics (+) Pregnancy                             Anesthesia Physical Anesthesia Plan  ASA: 3  Anesthesia Plan: Epidural   Post-op Pain Management:    Induction:   PONV Risk Score and Plan: 2 and Treatment may vary due to age or medical condition  Airway Management Planned: Natural Airway  Additional Equipment: None  Intra-op Plan:   Post-operative Plan:   Informed Consent: I have reviewed the patients History and Physical, chart, labs and discussed the procedure including the risks, benefits and alternatives for the proposed anesthesia with the patient or authorized representative who has indicated his/her understanding and acceptance.       Plan Discussed with: Anesthesiologist  Anesthesia Plan Comments: (Labs reviewed. Platelets acceptable, patient not taking any blood thinning medications. Per RN, FHR tracing reported to be stable enough for sitting procedure. Risks and benefits discussed with patient, including PDPH, backache, epidural hematoma, failed epidural, blood pressure changes, allergic reaction, and nerve injury. Patient  expressed understanding and wished to proceed.  Update: OB has called for c-section due to arrest of dilatation and fetal intolerance. Discussed with patient, still having some discomfort on lower abdomen which was minimally relieved by bolus earlier. Best option is to remove the epidural and perform a spinal for the c-section. Also discussed backup plan of general anesthesia were there to be difficulties with spinal. Patient expressed understanding and wished to proceed with spinal as first option.)       Anesthesia Quick Evaluation

## 2022-09-26 NOTE — Progress Notes (Signed)
Julia Taylor is a 37 y.o. G1P0000 at [redacted]w[redacted]d by 1st trim sono.  Subjective: S/p 3 Fentanyl doses, awaiting Anesth MD to come in for epidural   Objective: BP 133/73   Pulse 78   Temp 97.9 F (36.6 C) (Axillary)   Resp 16   Ht 5\' 1"  (1.549 m)   Wt 103.8 kg   LMP 12/09/2021   SpO2 97%   BMI 43.25 kg/m   FHT:  140s mod variability, + variable decels, intermittent. + accels. Cat I  UC:   q 3 min , pitocin at 5 mu  SVE:   Dilation: 5 Effacement (%): 70 Station: -3 Exam by:: Dr. Juliene Pina Foley expelled, AROM done, moderate mec fluid. Pt advised Pelvis borderline, high station are of concern but continue to assess  Labs: Lab Results  Component Value Date   WBC 7.8 09/26/2022   HGB 13.1 09/26/2022   HCT 38.4 09/26/2022   MCV 86.5 09/26/2022   PLT 292 09/26/2022    Assessment / Plan: IOL for AMA, gest age 37 wks S/p one Cytotec dose, now pitocin at 5 mu, Drop to 3 mu until comfortable with epidural Meconium noted, closely follow FHTs  Preeclampsia:   NA Fetal Wellbeing:  Category I Pain Control:  Epidural awaited now I/D:   GBS + PCN per protocol  Anticipated MOD:  working towards SVD but guarded   Robley Fries, MD 09/26/2022, 5:50 PM

## 2022-09-27 ENCOUNTER — Encounter (HOSPITAL_COMMUNITY): Payer: Self-pay | Admitting: Obstetrics & Gynecology

## 2022-09-27 ENCOUNTER — Encounter (HOSPITAL_COMMUNITY)
Admission: RE | Disposition: A | Discharge status: Home / Self Care | Payer: Self-pay | Source: Home / Self Care | Attending: Obstetrics & Gynecology

## 2022-09-27 ENCOUNTER — Other Ambulatory Visit: Payer: Self-pay

## 2022-09-27 ENCOUNTER — Inpatient Hospital Stay (HOSPITAL_COMMUNITY): Payer: 59

## 2022-09-27 DIAGNOSIS — O99892 Other specified diseases and conditions complicating childbirth: Secondary | ICD-10-CM | POA: Diagnosis present

## 2022-09-27 LAB — CBC
HCT: 38.6 % (ref 36.0–46.0)
Hemoglobin: 13 g/dL (ref 12.0–15.0)
MCH: 29.1 pg (ref 26.0–34.0)
MCHC: 33.7 g/dL (ref 30.0–36.0)
MCV: 86.4 fL (ref 80.0–100.0)
Platelets: 263 10*3/uL (ref 150–400)
RBC: 4.47 MIL/uL (ref 3.87–5.11)
RDW: 14.3 % (ref 11.5–15.5)
WBC: 17.8 10*3/uL — ABNORMAL HIGH (ref 4.0–10.5)
nRBC: 0 % (ref 0.0–0.2)

## 2022-09-27 LAB — CREATININE, SERUM
Creatinine, Ser: 0.58 mg/dL (ref 0.44–1.00)
GFR, Estimated: >60 mL/min (ref >60)

## 2022-09-27 SURGERY — Surgical Case
Anesthesia: Epidural

## 2022-09-27 MED ORDER — OXYTOCIN-SODIUM CHLORIDE 30-0.9 UT/500ML-% IV SOLN
INTRAVENOUS | Status: AC
  Filled 2022-09-27: qty 500

## 2022-09-27 MED ORDER — OXYTOCIN-SODIUM CHLORIDE 30-0.9 UT/500ML-% IV SOLN
INTRAVENOUS | Status: DC | PRN
  Administered 2022-09-27: 30 [IU] via INTRAVENOUS

## 2022-09-27 MED ORDER — FENTANYL CITRATE (PF) 100 MCG/2ML IJ SOLN
INTRAMUSCULAR | Status: DC | PRN
  Administered 2022-09-27: 15 ug via INTRATHECAL

## 2022-09-27 MED ORDER — SIMETHICONE 80 MG PO CHEW
80.0000 mg | CHEWABLE_TABLET | Freq: Three times a day (TID) | ORAL | Status: DC
  Administered 2022-09-27 – 2022-09-29 (×7): 80 mg via ORAL
  Filled 2022-09-27 (×7): qty 1

## 2022-09-27 MED ORDER — DIPHENHYDRAMINE HCL 25 MG PO CAPS: 25.0000 mg | ORAL_CAPSULE | Freq: Four times a day (QID) | ORAL | Status: DC | PRN

## 2022-09-27 MED ORDER — POVIDONE-IODINE 10 % EX SWAB
2.0000 | Freq: Once | CUTANEOUS | Status: AC
  Administered 2022-09-27: 2 via TOPICAL

## 2022-09-27 MED ORDER — PHENYLEPHRINE HCL-NACL 20-0.9 MG/250ML-% IV SOLN
INTRAVENOUS | Status: DC | PRN
  Administered 2022-09-27: 60 ug/min via INTRAVENOUS

## 2022-09-27 MED ORDER — SIMETHICONE 80 MG PO CHEW: 80.0000 mg | CHEWABLE_TABLET | ORAL | Status: DC | PRN

## 2022-09-27 MED ORDER — MEPERIDINE HCL 25 MG/ML IJ SOLN: 6.2500 mg | INTRAMUSCULAR | Status: DC | PRN

## 2022-09-27 MED ORDER — COCONUT OIL OIL
1.0000 | TOPICAL_OIL | Status: DC | PRN
  Administered 2022-09-27: 1 via TOPICAL

## 2022-09-27 MED ORDER — SODIUM CHLORIDE 0.9 % IV SOLN
INTRAVENOUS | Status: DC | PRN
Start: 2022-09-27 — End: 2022-09-27

## 2022-09-27 MED ORDER — LACTATED RINGERS IV SOLN: INTRAVENOUS | Status: DC | PRN

## 2022-09-27 MED ORDER — OXYCODONE HCL 5 MG PO TABS
5.0000 mg | ORAL_TABLET | ORAL | Status: DC | PRN
  Administered 2022-09-28 (×3): 5 mg via ORAL
  Filled 2022-09-27 (×3): qty 1

## 2022-09-27 MED ORDER — PHENYLEPHRINE HCL (PRESSORS) 10 MG/ML IV SOLN
INTRAVENOUS | Status: DC | PRN
  Administered 2022-09-27: 80 ug via INTRAVENOUS
  Administered 2022-09-27: 160 ug via INTRAVENOUS
  Administered 2022-09-27 (×2): 80 ug via INTRAVENOUS

## 2022-09-27 MED ORDER — FENTANYL CITRATE (PF) 100 MCG/2ML IJ SOLN: 25.0000 ug | INTRAMUSCULAR | Status: DC | PRN

## 2022-09-27 MED ORDER — MENTHOL 3 MG MT LOZG: 1.0000 | LOZENGE | OROMUCOSAL | Status: DC | PRN

## 2022-09-27 MED ORDER — SODIUM CHLORIDE 0.9 % IV SOLN
500.0000 mg | INTRAVENOUS | Status: AC
  Administered 2022-09-27: 500 mg via INTRAVENOUS

## 2022-09-27 MED ORDER — ENOXAPARIN SODIUM 40 MG/0.4ML IJ SOSY
40.0000 mg | PREFILLED_SYRINGE | INTRAMUSCULAR | Status: DC
  Filled 2022-09-27: qty 0.4

## 2022-09-27 MED ORDER — PHENYLEPHRINE 80 MCG/ML (10ML) SYRINGE FOR IV PUSH (FOR BLOOD PRESSURE SUPPORT)
PREFILLED_SYRINGE | INTRAVENOUS | Status: AC
  Filled 2022-09-27: qty 10

## 2022-09-27 MED ORDER — SENNOSIDES-DOCUSATE SODIUM 8.6-50 MG PO TABS
2.0000 | ORAL_TABLET | Freq: Every day | ORAL | Status: DC
  Administered 2022-09-28 – 2022-09-29 (×2): 2 via ORAL
  Filled 2022-09-27 (×2): qty 2

## 2022-09-27 MED ORDER — MORPHINE SULFATE (PF) 0.5 MG/ML IJ SOLN
INTRAMUSCULAR | Status: DC | PRN
  Administered 2022-09-27: .15 mg via INTRATHECAL

## 2022-09-27 MED ORDER — CEFAZOLIN SODIUM-DEXTROSE 2-4 GM/100ML-% IV SOLN
2.0000 g | INTRAVENOUS | Status: AC
  Administered 2022-09-27: 2 g via INTRAVENOUS

## 2022-09-27 MED ORDER — PRENATAL MULTIVITAMIN CH
1.0000 | ORAL_TABLET | Freq: Every day | ORAL | Status: DC
  Administered 2022-09-27 – 2022-09-29 (×3): 1 via ORAL
  Filled 2022-09-27 (×3): qty 1

## 2022-09-27 MED ORDER — ONDANSETRON HCL 4 MG/2ML IJ SOLN
INTRAMUSCULAR | Status: DC | PRN
  Administered 2022-09-27: 4 mg via INTRAVENOUS

## 2022-09-27 MED ORDER — ACETAMINOPHEN 325 MG PO TABS
650.0000 mg | ORAL_TABLET | ORAL | Status: DC | PRN
  Administered 2022-09-27 – 2022-09-28 (×3): 650 mg via ORAL
  Filled 2022-09-27 (×3): qty 2

## 2022-09-27 MED ORDER — BUPIVACAINE IN DEXTROSE 0.75-8.25 % IT SOLN
INTRATHECAL | Status: DC | PRN
  Administered 2022-09-27: 1.4 mL via INTRATHECAL

## 2022-09-27 MED ORDER — PROMETHAZINE HCL 25 MG/ML IJ SOLN: 6.2500 mg | INTRAMUSCULAR | Status: DC | PRN

## 2022-09-27 MED ORDER — OXYCODONE HCL 5 MG PO TABS: 5.0000 mg | ORAL_TABLET | Freq: Once | ORAL | Status: DC | PRN

## 2022-09-27 MED ORDER — IBUPROFEN 600 MG PO TABS
600.0000 mg | ORAL_TABLET | Freq: Four times a day (QID) | ORAL | Status: DC
  Administered 2022-09-28 – 2022-09-29 (×6): 600 mg via ORAL
  Filled 2022-09-27 (×6): qty 1

## 2022-09-27 MED ORDER — OXYTOCIN-SODIUM CHLORIDE 30-0.9 UT/500ML-% IV SOLN
2.5000 [IU]/h | INTRAVENOUS | Status: AC
  Administered 2022-09-27: 2.5 [IU]/h via INTRAVENOUS

## 2022-09-27 MED ORDER — ZOLPIDEM TARTRATE 5 MG PO TABS: 5.0000 mg | ORAL_TABLET | Freq: Every evening | ORAL | Status: DC | PRN

## 2022-09-27 MED ORDER — MORPHINE SULFATE (PF) 0.5 MG/ML IJ SOLN
INTRAMUSCULAR | Status: AC
  Filled 2022-09-27: qty 10

## 2022-09-27 MED ORDER — FENTANYL CITRATE (PF) 100 MCG/2ML IJ SOLN
INTRAMUSCULAR | Status: AC
  Filled 2022-09-27: qty 2

## 2022-09-27 MED ORDER — KETOROLAC TROMETHAMINE 30 MG/ML IJ SOLN
30.0000 mg | Freq: Four times a day (QID) | INTRAMUSCULAR | Status: AC
  Administered 2022-09-27 (×3): 30 mg via INTRAVENOUS
  Filled 2022-09-27 (×3): qty 1

## 2022-09-27 MED ORDER — DEXAMETHASONE SODIUM PHOSPHATE 4 MG/ML IJ SOLN
INTRAMUSCULAR | Status: AC
  Filled 2022-09-27: qty 2

## 2022-09-27 MED ORDER — ACETAMINOPHEN 10 MG/ML IV SOLN
INTRAVENOUS | Status: DC | PRN
Start: 2022-09-27 — End: 2022-09-27
  Administered 2022-09-27: 1000 mg via INTRAVENOUS

## 2022-09-27 MED ORDER — OXYCODONE HCL 5 MG/5ML PO SOLN: 5.0000 mg | Freq: Once | ORAL | Status: DC | PRN

## 2022-09-27 MED ORDER — ONDANSETRON HCL 4 MG/2ML IJ SOLN
INTRAMUSCULAR | Status: AC
  Filled 2022-09-27: qty 2

## 2022-09-27 SURGICAL SUPPLY — 38 items
APL PRP STRL LF DISP 70% ISPRP (MISCELLANEOUS) ×2
APL SKNCLS STERI-STRIP NONHPOA (GAUZE/BANDAGES/DRESSINGS) ×1
BENZOIN TINCTURE PRP APPL 2/3 (GAUZE/BANDAGES/DRESSINGS) IMPLANT
CHLORAPREP W/TINT 26 (MISCELLANEOUS) ×2 IMPLANT
CLAMP UMBILICAL CORD (MISCELLANEOUS) ×1 IMPLANT
CLOTH BEACON ORANGE TIMEOUT ST (SAFETY) ×1 IMPLANT
DRSG OPSITE POSTOP 4X10 (GAUZE/BANDAGES/DRESSINGS) ×1 IMPLANT
ELECT REM PT RETURN 9FT ADLT (ELECTROSURGICAL) ×1
ELECTRODE REM PT RTRN 9FT ADLT (ELECTROSURGICAL) ×1 IMPLANT
EXTRACTOR VACUUM KIWI (MISCELLANEOUS) IMPLANT
EXTRACTOR VACUUM M CUP 4 TUBE (SUCTIONS) IMPLANT
GLOVE BIO SURGEON STRL SZ7 (GLOVE) ×1 IMPLANT
GLOVE BIOGEL PI IND STRL 7.0 (GLOVE) ×1 IMPLANT
GOWN STRL REUS W/TWL LRG LVL3 (GOWN DISPOSABLE) ×2 IMPLANT
KIT ABG SYR 3ML LUER SLIP (SYRINGE) IMPLANT
MAT PREVALON FULL STRYKER (MISCELLANEOUS) IMPLANT
NDL HYPO 25X5/8 SAFETYGLIDE (NEEDLE) IMPLANT
NEEDLE HYPO 25X5/8 SAFETYGLIDE (NEEDLE) IMPLANT
NS IRRIG 1000ML POUR BTL (IV SOLUTION) ×1 IMPLANT
PACK C SECTION WH (CUSTOM PROCEDURE TRAY) ×1 IMPLANT
PAD OB MATERNITY 4.3X12.25 (PERSONAL CARE ITEMS) ×1 IMPLANT
RTRCTR C-SECT PINK 25CM LRG (MISCELLANEOUS) IMPLANT
STRIP CLOSURE SKIN 1/2X4 (GAUZE/BANDAGES/DRESSINGS) IMPLANT
SUT MNCRL 0 VIOLET CTX 36 (SUTURE) ×2 IMPLANT
SUT PLAIN 0 NONE (SUTURE) IMPLANT
SUT PLAIN 2 0 (SUTURE) ×1
SUT PLAIN ABS 2-0 CT1 27XMFL (SUTURE) IMPLANT
SUT VIC AB 0 CT1 27 (SUTURE) ×2
SUT VIC AB 0 CT1 27XBRD ANBCTR (SUTURE) ×2 IMPLANT
SUT VIC AB 2-0 CT1 27 (SUTURE) ×1
SUT VIC AB 2-0 CT1 TAPERPNT 27 (SUTURE) ×1 IMPLANT
SUT VIC AB 2-0 SH 27 (SUTURE) ×2
SUT VIC AB 2-0 SH 27XBRD (SUTURE) ×2 IMPLANT
SUT VIC AB 4-0 KS 27 (SUTURE) ×1 IMPLANT
SUT VICRYL 0 TIES 12 18 (SUTURE) IMPLANT
TOWEL OR 17X24 6PK STRL BLUE (TOWEL DISPOSABLE) ×1 IMPLANT
TRAY FOLEY W/BAG SLVR 14FR LF (SET/KITS/TRAYS/PACK) IMPLANT
WATER STERILE IRR 1000ML POUR (IV SOLUTION) ×1 IMPLANT

## 2022-09-27 NOTE — Transfer of Care (Signed)
Immediate Anesthesia Transfer of Care Note  Patient: Julia Taylor  Procedure(s) Performed: CESAREAN SECTION  Patient Location: PACU  Anesthesia Type:Spinal  Level of Consciousness: awake, alert , and oriented  Airway & Oxygen Therapy: Patient Spontanous Breathing  Post-op Assessment: Report given to RN and Post -op Vital signs reviewed and stable  Post vital signs: Reviewed and stable  Last Vitals:  Vitals Value Taken Time  BP 110/71 09/27/22 0315  Temp 36.9 C 09/27/22 0311  Pulse 89 09/27/22 0317  Resp 25 09/27/22 0317  SpO2 100 % 09/27/22 0317  Vitals shown include unfiled device data.  Last Pain:  Vitals:   09/27/22 0311  TempSrc: Oral  PainSc:          Complications: No notable events documented.

## 2022-09-27 NOTE — Anesthesia Postprocedure Evaluation (Signed)
Anesthesia Post Note  Patient: Julia Taylor  Procedure(s) Performed: CESAREAN SECTION     Patient location during evaluation: PACU Anesthesia Type: Epidural and Spinal Level of consciousness: awake and alert Pain management: pain level controlled Vital Signs Assessment: post-procedure vital signs reviewed and stable Respiratory status: spontaneous breathing and respiratory function stable Cardiovascular status: blood pressure returned to baseline and stable Postop Assessment: spinal receding and no apparent nausea or vomiting Anesthetic complications: no Comments: Epidural catheter placed earlier for labor was removed after entering OR for c-section. Spinal performed as primary anesthetic for C-section.   No notable events documented.  Last Vitals:  Vitals:   09/27/22 0334 09/27/22 0345  BP: 112/69 98/60  Pulse: 85 81  Resp: (!) 23 (!) 24  Temp:    SpO2: 99% 97%    Last Pain:  Vitals:   09/27/22 0345  TempSrc:   PainSc: 3    Pain Goal: Patients Stated Pain Goal: 0 (09/27/22 0345)  LLE Motor Response: Purposeful movement (09/27/22 0345) LLE Sensation: Increased (09/27/22 0345) RLE Motor Response: Purposeful movement (09/27/22 0345) RLE Sensation: Increased (09/27/22 0345)     Epidural/Spinal Function Cutaneous sensation: Able to Wiggle Toes (09/27/22 0345), Patient able to flex knees: Yes (09/27/22 0345), Patient able to lift hips off bed: No (09/27/22 0345), Back pain beyond tenderness at insertion site: No (09/27/22 0345), Progressively worsening motor and/or sensory loss: No (09/27/22 0345), Bowel and/or bladder incontinence post epidural: No (09/27/22 0345)  Beryle Lathe

## 2022-09-27 NOTE — Op Note (Signed)
Cesarean Section Procedure Note   Julia Taylor  09/27/2022  Procedure: Primary Low Transverse Cesarean section  Indications:  Arrest of dilation, fetal intolerance to labor     Pre-operative Diagnosis: arrest of dilation; nonreassuring fetal heart tracing.   Post-operative Diagnosis: Same   Surgeon: Shea Evans, MD - Primary   Assistants: none  Anesthesia: spinal   Procedure Details:  The patient was seen in the Labor Room. The risks, benefits, complications, treatment options, and expected outcomes were discussed with the patient. The patient concurred with the proposed plan, giving informed consent. identified as [name] and the procedure verified as C-Section Delivery. A Time Out was held and the above information confirmed. Labor epidural was not working well, so Spinal was placed.  After induction of anesthesia, the patient was draped and prepped in the usual sterile manner, foley was draining urine well.  A pfannenstiel incision was made and carried down through the subcutaneous tissue to the fascia. Fascial incision was made and extended transversely. The fascia was separated from the underlying rectus tissue superiorly and inferiorly. The peritoneum was identified and entered. Peritoneal incision was extended longitudinally. Alexis-O retractor placed. The utero-vesical peritoneal reflection was incised along with low transverse hysterotomy. Thick meconium noted at hysterotomy. Delivered from asynclitic occiput transverse cephalic presentation was a FEMALE infant with vigorous cry. Loose nuchal cord released over the head. Delayed cord clamping done at 1 minute and baby handed to NICU team in attendance. Apgar scores of 8 at one minute and 9 at five minutes. Cord ph was not sent. Cord blood was obtained for evaluation. The placenta was removed Intact and appeared normal. The uterine outline, tubes and ovaries appeared normal}. The uterine incision was closed with running locked  sutures of . A second imbricating layer sutured.   Hemostasis was observed. Alexis retractor removed. Peritoneal closure done with 2-0 Vicryl.  The fascia was then reapproximated with running sutures of 0Vicryl. The subcuticular closure was performed using 2-0plain gut. The skin was closed with 4-0Vicryl. Honeycomb and steristrips placed.   Instrument, sponge, and needle counts were correct prior the abdominal closure and were correct at the conclusion of the case.   Findings:Female infant, Apgars 8 and 9. Cephalic delivery from left occiput transverse and asynclitic head. Loose nuchal cord. Thick meconium, Boy 5 lb 8.5 oz.   Estimated Blood Loss: 253 cc  Total IV Fluids: 2000 ml LR   Urine Output:  300 CC OF clear urine  Specimens: cord blood and placenta to path   Complications: no complications  Disposition: PACU - hemodynamically stable.   Maternal Condition: stable   Baby condition / location:  Couplet care / Skin to Skin  Attending Attestation: I performed the procedure.   Signed: Surgeon(s): Shea Evans, MD

## 2022-09-27 NOTE — Anesthesia Procedure Notes (Signed)
Spinal  Patient location during procedure: OR Start time: 09/27/2022 1:50 AM End time: 09/27/2022 1:55 AM Reason for block: surgical anesthesia Staffing Performed: anesthesiologist  Anesthesiologist: Beryle Lathe, MD Performed by: Beryle Lathe, MD Authorized by: Beryle Lathe, MD   Preanesthetic Checklist Completed: patient identified, IV checked, risks and benefits discussed, surgical consent, monitors and equipment checked, pre-op evaluation and timeout performed Spinal Block Patient position: sitting Prep: DuraPrep Patient monitoring: heart rate, cardiac monitor, continuous pulse ox and blood pressure Approach: midline Location: L3-4 Injection technique: single-shot Needle Needle type: Quincke  Needle gauge: 22 G Needle length: 12.7 cm Additional Notes Consent was obtained prior to the procedure with all questions answered and concerns addressed. Risks including, but not limited to, bleeding, infection, nerve damage, paralysis, failed block, inadequate analgesia, allergic reaction, high spinal, itching, and headache were discussed and the patient wished to proceed. Functioning IV was confirmed and monitors were applied. Sterile prep and drape, including hand hygiene, mask, and sterile gloves were used. The previously placed epidural catheter was removed, tip intact. The patient was positioned and the spine was prepped. The skin was anesthetized with lidocaine. Free flow of clear CSF was obtained prior to injecting local anesthetic into the CSF. The spinal needle aspirated freely following injection. The needle was carefully withdrawn. The patient tolerated the procedure well.   Leslye Peer, MD

## 2022-09-27 NOTE — Progress Notes (Signed)
Patient ID: Julia Taylor, female   DOB: Jun 09, 1985, 37 y.o.   MRN: [id number] RN went back to restart pitocin at 11.15 pm and patient declined, says she wants C/section. Risks/complications of surgery reviewed incl infection, bleeding, damage to internal organs including bladder, bowels, ureters, blood vessels, other risks from anesthesia, VTE and delayed complications of any surgery, complications in future surgery reviewed. Also discussed neonatal complications incl difficult delivery, laceration, vacuum assistance, TTN etc. Pt understands and agrees, all concerns addressed.    Aesthes MD in room, advising spinal since epidural w/ patchy relief   Pt tearful but not wanting to restart pitocin. Aldo offered to check w/ Anesth MD to consider replacing epidural, she declined.

## 2022-09-27 NOTE — Lactation Note (Signed)
This note was copied from a baby's chart. Lactation Consultation Note  Patient Name: Julia Taylor Today's Date: 09/27/2022 Age:35 hours, P1  Reason for consult: Initial assessment, Term, less than 6 pound, 1st time BF 1st pregnancy.  Per mom baby due to feed and has not latched yet because I've been waiting for lactation. Stooled x 3, HNV .  LC offered to assist, and mom receptive. Started in the football position on the left breast and noted areola edema, slightly compressible and will need some coconut oil and consistent pumping using the DEBP, Attempted to latch with score of 3 .  Mom aware the baby had to be fed by supplementing which has been started earlier today. Baby tolerated 15 ml for formula pace feeding and , small amount of spitting afterwards.  Mom pumped with DEBP after LC set up and f;lange was checked. LC asked the nurse to provide coconut oil to soften the tissue.  LC Plan: due to areola edema Coconut oil on the areola with pumping both breast 8-10 times a day after baby is settled.  LC reassured mom breast edema takes time and the consistent pumping and coconut oil can help.  LC reviewed to feed baby with feeding cues and by 3 hours following the guidelines for a baby less than 6 pounds.  Both mom and dad aware.     Maternal Data Has patient been taught Hand Expression?: Yes Does the patient have breastfeeding experience prior to this delivery?: No  Feeding Mother's Current Feeding Choice: Breast Milk and Formula Nipple Type: Nfant Standard Flow (white)  LATCH Score Latch: Too sleepy or reluctant, no latch achieved, no sucking elicited.  Audible Swallowing: None  Type of Nipple: Flat (areola edema - tough)  Comfort (Breast/Nipple): Soft / non-tender  Hold (Positioning): Full assist, staff holds infant at breast  LATCH Score: 3   Lactation Tools Discussed/Used Tools: Pump;Flanges Flange Size: 21 Breast pump type: Double-Electric Breast  Pump;Manual Pump Education: Setup, frequency, and cleaning;Milk Storage Reason for Pumping: due to DL , Less yjan 6 pounds Pumped volume:  (mom still pumping)  Interventions Interventions: Breast feeding basics reviewed;Assisted with latch;Skin to skin;Breast massage;Hand express;Reverse pressure;Breast compression;Adjust position;Support pillows;Position options;Coconut oil;Hand pump;DEBP;Education;LC Services brochure  Discharge Pump: Hands Free;Personal;Manual  Consult Status Consult Status: Follow-up Date: 09/28/22    Kathrin Greathouse 09/27/2022, 2:46 PM

## 2022-09-27 NOTE — Progress Notes (Signed)
Subjective: Postpartum Day 0: Emerg 1' Cesarean Delivery, girl, SGA at 2.26 am  Patient reports pain well controlled, Not ambulated. Ate regular food, no n/v. NO HA/ CP/ SOB . Foley in place .    Objective: Vital signs in last 24 hours: Temp:  [97.9 F (36.6 C)-99.8 F (37.7 C)] 98.3 F (36.8 C) (09/25 0740) Pulse Rate:  [61-98] 72 (09/25 0740) Resp:  [15-24] 17 (09/25 0740) BP: (90-149)/(48-107) 95/52 (09/25 0740) SpO2:  [94 %-100 %] 100 % (09/25 0740)  Physical Exam:  General: alert and cooperative Lochia: appropriate Uterine Fundus: firm Incision: no significant drainage DVT Evaluation: No evidence of DVT seen on physical exam. Negative Homan's sign.     Latest Ref Rng & Units 09/27/2022    5:52 AM 09/26/2022    6:37 AM 05/28/2022   12:41 AM  CBC  WBC 4.0 - 10.5 K/uL 17.8  7.8  11.2   Hemoglobin 12.0 - 15.0 g/dL 82.9  56.2  13.0   Hematocrit 36.0 - 46.0 % 38.6  38.4  36.4   Platelets 150 - 400 K/uL 263  292  280      Assessment/Plan: Status post Cesarean section. POD #-0 1LTCS for arrest of dilation and fetal intolerance to labor, maternal pain.  Girl, 5 lb 8.5 oz , breast and formula  Doing well postoperatively.  [facility] consult Continue current care. Ppst op and pp care dw pt.   Robley Fries, MD 09/27/2022, 10:35 AM

## 2022-09-28 LAB — SURGICAL PATHOLOGY

## 2022-09-28 MED ORDER — ACETAMINOPHEN 500 MG PO TABS
1000.0000 mg | ORAL_TABLET | Freq: Four times a day (QID) | ORAL | Status: DC | PRN
  Administered 2022-09-28 – 2022-09-29 (×4): 1000 mg via ORAL
  Filled 2022-09-28 (×4): qty 2

## 2022-09-28 NOTE — Progress Notes (Signed)
Subjective: Postpartum Day One: Cesarean Delivery Patient reports feeling better today. Minimal incisional pain, primary back of neck and upper back but pain medications helping along with heating pad. Ambulating without dizziness. VB light and minimal. Voiding without difficulties. Passing gas. Tolerating regular diet without N/V. No CP, SOB, or HA.  Baby girl is doing well at bedside.     Objective: Patient Vitals for the past 24 hrs:  BP Temp Temp src Pulse Resp SpO2  09/28/22 0604 113/67 98.3 F (36.8 C) -- 82 16 100 %  09/27/22 1950 105/65 98.5 F (36.9 C) Oral 75 18 100 %  09/27/22 1547 (!) 94/56 98 F (36.7 C) Oral 80 16 100 %    Intake/Output Summary (Last 24 hours) at 09/28/2022 1300 Last data filed at 09/27/2022 2140 Gross per 24 hour  Intake --  Output 500 ml  Net -500 ml     Physical Exam:  General: alert and no distress Lochia: appropriate Uterine Fundus: firm Incision: healing well, no significant drainage DVT Evaluation: No evidence of DVT seen on physical exam.  Recent Labs    09/26/22 0637 09/27/22 0552  HGB 13.1 13.0  HCT 38.4 38.6    Assessment/Plan: Status post Cesarean section. Doing well postoperatively.  Continue current care. Jaysie Huguley G1P1001 POD#1 sp primary c/s at [redacted]w[redacted]d for arrest of descent 1. PPC: cont current PO pain regimen, encourage ambulation, regular diet 2. PostOp Hgb stable 13.1  3. RH POS, Rubella Imm 4. LC PRN 5. Anticipate DC home tomorrow POD2  Daril Warga A Daron Stutz 09/28/2022, 12:59 PM

## 2022-09-29 LAB — CBC
HCT: 32.9 % — ABNORMAL LOW (ref 36.0–46.0)
Hemoglobin: 10.8 g/dL — ABNORMAL LOW (ref 12.0–15.0)
MCH: 28.5 pg (ref 26.0–34.0)
MCHC: 32.8 g/dL (ref 30.0–36.0)
MCV: 86.8 fL (ref 80.0–100.0)
Platelets: 213 10*3/uL (ref 150–400)
RBC: 3.79 MIL/uL — ABNORMAL LOW (ref 3.87–5.11)
RDW: 15.1 % (ref 11.5–15.5)
WBC: 8.2 10*3/uL (ref 4.0–10.5)
nRBC: 0 % (ref 0.0–0.2)

## 2022-09-29 MED ORDER — IBUPROFEN 600 MG PO TABS: 600.0000 mg | ORAL_TABLET | Freq: Four times a day (QID) | ORAL | 0 refills | Status: DC

## 2022-09-29 MED ORDER — OXYCODONE HCL 5 MG PO TABS: 5.0000 mg | ORAL_TABLET | ORAL | 0 refills | Status: DC | PRN

## 2022-09-29 NOTE — Lactation Note (Signed)
This note was copied from a baby's chart. Lactation Consultation Note  Patient Name: Julia Taylor Date: 09/29/2022 Age:35 hours Reason for consult: Initial assessment;Primapara;Term Mom has just been BF recently d/t unable to latch baby d/t edema. Mom hasn't pumped recently d/t mom stated when she pumps she doesn't get anything. Reviewed mature milk, colostrum, milk storage, pumping, bra's, engorgement and management. Gave mom information sheet on how much the baby needs to eat if she isn't going to the breast. Baby needs to have increase feeding amounts. Answered questions mom had. Reminded mom of OP LC and support groups. Gave mom shells to wear in bra to soften breast tissue. Mom is in hopes to go home today feeling good about her feeding plan.   Maternal Data Does the patient have breastfeeding experience prior to this delivery?: No  Feeding Mother's Current Feeding Choice: Formula Nipple Type: Nfant Standard Flow (white)  LATCH Score       Type of Nipple: Everted at rest and after stimulation  Comfort (Breast/Nipple): Filling, red/small blisters or bruises, mild/mod discomfort (edematous)         Lactation Tools Discussed/Used Tools: Shells  Interventions Interventions: Breast feeding basics reviewed;Shells;Education  Discharge    Consult Status Consult Status: Complete Date: 09/29/22    Charyl Dancer 09/29/2022, 4:30 AM

## 2022-09-29 NOTE — Progress Notes (Signed)
No c/o; pain controlled, tol po, ambulating, +flatus Voids w/o difficulty, nml lochia Breastfeeding Wanting d/c home today   Patient Vitals for the past 24 hrs:  BP Temp Temp src Pulse Resp SpO2  09/29/22 0557 111/71 98.4 F (36.9 C) Oral 79 16 98 %  09/28/22 2035 (!) 109/50 98.4 F (36.9 C) Oral 86 16 100 %  09/28/22 1507 114/65 -- -- 72 18 --  09/28/22 1416 108/64 -- -- 86 16 --   A&ox3 Rrr Ctab Abd: soft,nt,nd; fundus firm and below umb, dressing c/d/I LE: no edema, nt bilat     Latest Ref Rng & Units 09/27/2022    5:52 AM 09/26/2022    6:37 AM 05/28/2022   12:41 AM  CBC  WBC 4.0 - 10.5 K/uL 17.8  7.8  11.2   Hemoglobin 12.0 - 15.0 g/dL 16.1  09.6  04.5   Hematocrit 36.0 - 46.0 % 38.6  38.4  36.4   Platelets 150 - 400 K/uL 263  292  280    A/P: pod2 s/p 1ltcs for arrest of dilation/fetal intolerance to labor Doing well - d/c home today; plan 6 wk f/u Contin care RH pos Rubella Immune Obese - contin ambulation MS - stable Elevated wbc 2 days ago, will repeat today - suspect trending down, afebrile

## 2022-09-29 NOTE — Discharge Instructions (Signed)
WHAT TO LOOK OUT FOR: Fever of 100.4 or above Mastitis: feels like flu and breasts hurt Infection: increased pain, swelling or redness Blood clots golf ball size or larger Postpartum depression   Congratulations on your newest addition!

## 2022-09-29 NOTE — Discharge Summary (Signed)
Postpartum Discharge Summary  Date of Service updated     Patient Name: Julia Taylor DOB: [birth year] MRN: [id number]  Date of admission: 09/26/2022 Delivery date:09/27/2022 Delivering provider: MODY, VAISHALI Date of discharge: 09/29/2022  Admitting diagnosis: Encounter for induction of labor [Z34.90] Delivery by emergency cesarean [O99.892] Intrauterine pregnancy: [redacted]w[redacted]d     Secondary diagnosis:  Principal Problem:   Encounter for induction of labor Active Problems:   Delivery by emergency cesarean  Additional problems: none    Discharge diagnosis: Term Pregnancy Delivered                                              Post partum procedures: none Hospital course: Induction of Labor With Cesarean Section   37 y.o. yo G1P1001 at [redacted]w[redacted]d was admitted to the hospital 09/26/2022 for induction of labor. Patient had a labor course significant for arrest of dilation and nrfht. The patient went for cesarean section due to Arrest of Dilation and Non-Reassuring FHR. Delivery details are as follows: Membrane Rupture Time/Date: 2:25 AM,09/27/2022  Delivery Method:C-Section, Low Transverse Details of operation can be found in separate operative Note.  Patient had a postpartum course, uncomplicated. She is ambulating, tolerating a regular diet, passing flatus, and urinating well.  Patient is discharged home in stable condition on 09/29/22.      Newborn Data: Birth date:09/27/2022 Birth time:2:26 AM Gender:Female Living status:Living Apgars:8 ,9  Weight:2510 g                               Immunizations administered: Immunization History  Administered Date(s) Administered   Influenza,inj,Quad PF,6+ Mos 12/05/2017, 12/18/2018   Influenza-Unspecified 10/02/2016   Moderna Sars-Covid-2 Vaccination 10/25/2019, 11/22/2019   Tdap 10/30/2011    Physical exam  Vitals:   09/28/22 1416 09/28/22 1507 09/28/22 2035 09/29/22 0557  BP: 108/64 114/65 (!) 109/50 111/71  Pulse: 86 72 86 79  Resp: 16  18 16 16   Temp:   98.4 F (36.9 C) 98.4 F (36.9 C)  TempSrc:   Oral Oral  SpO2:   100% 98%  Weight:      Height:       General: alert, cooperative, and no distress Lochia: appropriate Uterine Fundus: firm Incision: Dressing is clean, dry, and intact DVT Evaluation: No evidence of DVT seen on physical exam. Labs: Lab Results  Component Value Date   WBC 17.8 (H) 09/27/2022   HGB 13.0 09/27/2022   HCT 38.6 09/27/2022   MCV 86.4 09/27/2022   PLT 263 09/27/2022      Latest Ref Rng & Units 09/27/2022    5:52 AM  CMP  Creatinine 0.44 - 1.00 mg/dL 8.29    Edinburgh Score:    09/27/2022   11:48 AM  Edinburgh Postnatal Depression Scale Screening Tool  I have been able to laugh and see the funny side of things. 0  I have looked forward with enjoyment to things. 0  I have blamed myself unnecessarily when things went wrong. 0  I have been anxious or worried for no good reason. 2  I have felt scared or panicky for no good reason. 1  Things have been getting on top of me. 1  I have been so unhappy that I have had difficulty sleeping. 0  I have felt sad or miserable. 0  I  have been so unhappy that I have been crying. 0  The thought of harming myself has occurred to me. 0  Edinburgh Postnatal Depression Scale Total 4      After visit meds:  Allergies as of 09/29/2022       Reactions   Dilantin [phenytoin] Other (See Comments)   seizures   Phenobarbital Other (See Comments)   Seizure   Tegretol [carbamazepine] Other (See Comments)   Seizures        Medication List     STOP taking these medications    doxylamine (Sleep) 25 MG tablet Commonly known as: UNISOM   ondansetron 4 MG disintegrating tablet Commonly known as: ZOFRAN-ODT   pyridOXINE 25 MG tablet Commonly known as: VITAMIN B6   UNABLE TO FIND   Zeposia 0.92 MG Caps Generic drug: Ozanimod HCl       TAKE these medications    fluticasone 50 MCG/ACT nasal spray Commonly known as: FLONASE Place 2  sprays into both nostrils daily.   ibuprofen 600 MG tablet Commonly known as: ADVIL Take 1 tablet (600 mg total) by mouth every 6 (six) hours.   latanoprost 0.005 % ophthalmic solution Commonly known as: XALATAN Place 1 drop into both eyes at bedtime.   oxyCODONE 5 MG immediate release tablet Commonly known as: Oxy IR/ROXICODONE Take 1-2 tablets (5-10 mg total) by mouth every 4 (four) hours as needed for moderate pain.   Prenatal Vitamins 28-0.8 MG Tabs Take 1 tablet by mouth daily at 6 (six) AM.         Discharge home in stable condition Infant Feeding: Breast Infant Disposition:home with mother Discharge instruction: per After Visit Summary and Postpartum booklet. Activity: Advance as tolerated. Pelvic rest for 6 weeks.  Diet: routine diet Anticipated Birth Control: Unsure Postpartum Appointment:6 weeks Future Appointments:No future appointments. Follow up Visit:  Follow-up Information     Shea Evans, MD Follow up.   Specialty: Obstetrics and Gynecology Contact information: [address] [place] [address] [phone]                     09/29/2022 Vick Frees, MD

## 2022-10-02 ENCOUNTER — Encounter: Payer: Self-pay | Admitting: Family Medicine

## 2022-10-02 ENCOUNTER — Ambulatory Visit: Payer: 59 | Admitting: Student

## 2022-10-02 VITALS — BP 134/84 | HR 74 | Ht 61.0 in | Wt 221.4 lb

## 2022-10-02 DIAGNOSIS — K5900 Constipation, unspecified: Secondary | ICD-10-CM

## 2022-10-02 DIAGNOSIS — R519 Headache, unspecified: Secondary | ICD-10-CM

## 2022-10-02 MED ORDER — IBUPROFEN 800 MG PO TABS
800.0000 mg | ORAL_TABLET | Freq: Three times a day (TID) | ORAL | 0 refills | Status: DC | PRN
Start: 2022-10-02 — End: 2023-04-27

## 2022-10-02 MED ORDER — POLYETHYLENE GLYCOL 3350 17 G PO PACK
17.0000 g | PACK | Freq: Every day | ORAL | 0 refills | Status: DC
Start: 2022-10-02 — End: 2023-02-05

## 2022-10-02 NOTE — Patient Instructions (Addendum)
It was great to see you! Thank you for allowing me to participate in your care!   I recommend that you always bring your medications to each appointment as this makes it easy to ensure we are on the correct medications and helps [place] not miss when refills are needed.  Our plans for today:  - I recommend stopping Oxycodone - Take Tylenol and ibuprofen for headaches - Take Miralax once or twice daily until you have a soft and full bowel movement - Follow-up in a few days to recheck blood pressure   Take care and seek immediate care sooner if you develop any concerns. Please remember to show up 15 minutes before your scheduled appointment time!  Tiffany Kocher, DO [facility]

## 2022-10-02 NOTE — Progress Notes (Deleted)
    SUBJECTIVE:   CHIEF COMPLAINT / HPI:   ***  PERTINENT  PMH / PSH: ***  OBJECTIVE:   BP 134/84   Pulse 74   Ht 5\' 1"  (1.549 m)   Wt 221 lb 6.4 oz (100.4 kg)   LMP 12/09/2021   SpO2 100%   Breastfeeding Yes   BMI 41.83 kg/m    General: NAD, pleasant Cardio: RRR, no MRG. Cap Refill <2s. Respiratory: CTAB, normal wob on RA GI: Abdomen is soft, not tender, not distended. BS present Skin: Warm and dry Neuro: CN II: PERRL CN III, IV,VI: EOMI CV V: Normal sensation in V1, V2, V3 CVII: Symmetric smile and brow raise CN VIII: Normal hearing CN IX,X: Symmetric palate raise  CN XI: 5/5 shoulder shrug CN XII: Symmetric tongue protrusion  UE and LE strength 5/5 2+ UE and LE reflexes  Normal sensation in UE and LE bilaterally  No ataxia with finger to nose, normal heel to shin  Negative Rhomberg   ASSESSMENT/PLAN:   No problem-specific Assessment & Plan notes found for this encounter.     Tiffany Kocher, DO [facility]

## 2022-10-02 NOTE — Progress Notes (Signed)
    SUBJECTIVE:   CHIEF COMPLAINT / HPI:   Headache constipation Patient is postpartum, C-section on 09/27/2022.  Reports she has been having headaches since delivery.  No NVD, no swelling, no dizzines, no blurry vision, no SOB. Taking oxycodone and Tylenol, this helps pain but headache immediately returns after medicine wears off.  No history of hypertension during pregnancy.  Thinks she has been staying hydrated.  She has not had a bowel movement since prior to her admission for delivery.  Additionally patient has significant stress related to breast-feeding, her milk supply has not come in.  OBJECTIVE:   BP 134/84   Pulse 74   Ht 5\' 1"  (1.549 m)   Wt 221 lb 6.4 oz (100.4 kg)   LMP 12/09/2021   SpO2 100%   Breastfeeding Yes   BMI 41.83 kg/m    General: NAD, pleasant Cardio: RRR, no MRG. Cap Refill <2s. Respiratory: CTAB, normal wob on RA GI: Abdomen is soft, not tender, not distended. BS present Skin: Warm and dry Neuro: CN II: PERRL CN III, IV,VI: EOMI CV V: Normal sensation in V1, V2, V3 CVII: Symmetric smile and brow raise CN VIII: Normal hearing CN IX,X: Symmetric palate raise  CN XI: 5/5 shoulder shrug CN XII: Symmetric tongue protrusion  UE and LE strength 5/5 Normal sensation in UE and LE bilaterally   ASSESSMENT/PLAN:   Assessment & Plan Acute nonintractable headache, unspecified headache type Migraine versus stress headache, in setting of significant stressors at home with new baby and difficulty breast-feeding.  Benign physical exam.  No hypertension, but would recommend follow-up tomorrow to recheck BP and discuss lactation which is a major stressor for her.  Strict ED precautions discussed, regarding postpartum preeclampsia. - Stop taking oxycodone - Ibuprofen and Tylenol - Hydration - Follow-up scheduled for tomorrow Constipation, unspecified constipation type Likely secondary to opioid use and postoperative state. - Start MiraLAX daily, can increase to  twice daily  Breast Feeding Handout for lymphatic drainage technique provided. Follow-up tomorrow.  Tiffany Kocher, DO [facility]

## 2022-10-03 ENCOUNTER — Ambulatory Visit: Payer: 59 | Admitting: Family Medicine

## 2022-10-03 VITALS — BP 129/82 | HR 70

## 2022-10-03 DIAGNOSIS — R030 Elevated blood-pressure reading, without diagnosis of hypertension: Secondary | ICD-10-CM | POA: Diagnosis not present

## 2022-10-03 DIAGNOSIS — Z9189 Other specified personal risk factors, not elsewhere classified: Secondary | ICD-10-CM | POA: Insufficient documentation

## 2022-10-03 NOTE — Assessment & Plan Note (Addendum)
-  Unable to successfully latch baby today, suspect due to SGA and immature oromotor skills. Advised mom that latching skills can take time to develop, and they can continue practicing intermittently at home. -Assisted mom with expression of milk via harmony pump, collected 15ml which was then fed to baby via bottle. Patient very reassured to see she has breastmilk and is able to express it. -Discussed use of DEBP at home, getting pumping bra to aid in hands-free pumping. -She will continue formula feeding for now and will give baby what she is able to pump via bottle. -Advised not to over-pump to avoid hyperlactation -Patient was given handout yesterday on lymphatic drainage techniques to aide with treating engorgement -Offered to schedule appointment for follow up, or to see her back as needed, she will reach out to me if she wants to be seen again -Patient very appreciative and agreeable with this plan

## 2022-10-03 NOTE — Progress Notes (Signed)
Date of Visit: 10/03/2022   Breastfeeding Medicine Initial Consult Note  SUBJECTIVE:   CC: breastfeeding difficulty  Referral from: [facility], patient seen here yesterday for headache and endorsed breastfeeding issues Baby's Pediatrician: Lambert Mody, PA-C with [facility] Mom's OBGyn/Midwife: Shea Evans, MD with [facility] Mom's PCP:  Penne Lash, MD at [facility]  Mother's Breastfeeding Goal: give baby some breast milk, either via direct feeding, or expressing milk and feeding via bottle. Is ok with continuing formula, just wants to provide some breastmilk for baby as much as she is able Baby's age today: 6 days  Pregnancy/Birth History: -G1P1001, delivered at [redacted]w[redacted]d weeks gestation via cesarean delivery for arrest of dilation and NRFHT after initial IOL for postdates. Labor course complicated by thick mec, required both epidural while laboring and then spinal anesthesia due to patchy relief with epidural. -Pregnancy complicated by: multiple sclerosis, not on any MS medications during pregnancy, also history of glaucoma on eye drops, uterine fibroids, trich in first trimester (treated), poor fetal growth at 33w but improved at 36w to AGA, GBS pos  Birth weight: 2510g (5lb 8.5oz) - small for gestational age Discharge weight: 2586 g (5 lb 11.2 oz) on 9/27 Weight at pediatrician office 9/30: 2665g (5lb 14oz)  Feeding Details: Directly Breastfeeding:  none, has not been latching well Pumping: 15-20 minutes when able, getting drops out, did not pump at all overnight. Has done some hand expression and fed milk to baby via spoon Has otherwise been feeding formula via bottle with good weight gain Type of pump:  has DEBP but has not been able to get any milk out, also has hand pump (medela harmony) and has expressed some milk with that  OBJECTIVE:   Maternal breast exam: - bilateral breasts engorged, no warmth or erythema - able to  hand express droplets - no signs of nipple trauma  Assisted mom with attempting to latch baby to breast: L breast in cross cradle and football hold R breast in cross cradle hold  Baby would not latch to breast in any position. Did generate good vacuum suck with gloved finger. Palate intact.  Assisted mom with use of harmony pump and between both breasts was able to express 15ml, which was then fed to baby via bottle.    ASSESSMENT/PLAN:   Assessment & Plan Breastfeeding problem -Unable to successfully latch baby today, suspect due to SGA and immature oromotor skills. Advised mom that latching skills can take time to develop, and they can continue practicing intermittently at home. -Assisted mom with expression of milk via harmony pump, collected 15ml which was then fed to baby via bottle. Patient very reassured to see she has breastmilk and is able to express it. -Discussed use of DEBP at home, getting pumping bra to aid in hands-free pumping. -She will continue formula feeding for now and will give baby what she is able to pump via bottle. -Advised not to over-pump to avoid hyperlactation -Patient was given handout yesterday on lymphatic drainage techniques to aide with treating engorgement -Offered to schedule appointment for follow up, or to see her back as needed, she will reach out to me if she wants to be seen again -Patient very appreciative and agreeable with this plan Elevated blood pressure reading No headaches today, but did have headache yesterday prompting her to seek care here at the [facility]. Blood pressure initially borderline at 139/76. Repeated at end of visit and had elevated SBP and DBP in R  arm, normal in L arm, and again elevated DBP in L arm. Patient followed by OB/GYN during pregnancy and has been in touch with OB office. Advised contacting OB office for follow up today to recheck BP and potentially obtain PIH labs.    Levert Feinstein, MD,  IBCLC [facility]   Greater than 30 minutes were spent on this encounter on the day of service, including pre-visit planning, actual face to face time, coordination of care, and documentation of visit.

## 2022-10-04 ENCOUNTER — Inpatient Hospital Stay (HOSPITAL_COMMUNITY)
Admission: AD | Admit: 2022-10-04 | Discharge: 2022-10-04 | Disposition: A | Discharge status: Home / Self Care | Payer: 59 | Attending: Obstetrics | Admitting: Obstetrics

## 2022-10-04 DIAGNOSIS — O9089 Other complications of the puerperium, not elsewhere classified: Secondary | ICD-10-CM | POA: Diagnosis not present

## 2022-10-04 DIAGNOSIS — R112 Nausea with vomiting, unspecified: Secondary | ICD-10-CM | POA: Diagnosis not present

## 2022-10-04 DIAGNOSIS — K5900 Constipation, unspecified: Secondary | ICD-10-CM | POA: Diagnosis not present

## 2022-10-04 LAB — COMPREHENSIVE METABOLIC PANEL
ALT: 23 U/L (ref 0–44)
AST: 20 U/L (ref 15–41)
Albumin: 3 g/dL — ABNORMAL LOW (ref 3.5–5.0)
Alkaline Phosphatase: 81 U/L (ref 38–126)
Anion gap: 8 (ref 5–15)
BUN: 11 mg/dL (ref 6–20)
CO2: 23 mmol/L (ref 22–32)
Calcium: 9.5 mg/dL (ref 8.9–10.3)
Chloride: 109 mmol/L (ref 98–111)
Creatinine, Ser: 0.63 mg/dL (ref 0.44–1.00)
GFR, Estimated: >60 mL/min (ref >60)
Glucose, Bld: 83 mg/dL (ref 70–99)
Potassium: 3.8 mmol/L (ref 3.5–5.1)
Sodium: 140 mmol/L (ref 135–145)
Total Bilirubin: 0.2 mg/dL — ABNORMAL LOW (ref 0.3–1.2)
Total Protein: 6.4 g/dL — ABNORMAL LOW (ref 6.5–8.1)

## 2022-10-04 LAB — URINALYSIS, ROUTINE W REFLEX MICROSCOPIC
Bilirubin Urine: NEGATIVE
Glucose, UA: NEGATIVE mg/dL
Ketones, ur: NEGATIVE mg/dL
Nitrite: NEGATIVE
Protein, ur: NEGATIVE mg/dL
Specific Gravity, Urine: 1.016 (ref 1.005–1.030)
pH: 5 (ref 5.0–8.0)

## 2022-10-04 LAB — CBC
HCT: 38.5 % (ref 36.0–46.0)
Hemoglobin: 12.9 g/dL (ref 12.0–15.0)
MCH: 29.5 pg (ref 26.0–34.0)
MCHC: 33.5 g/dL (ref 30.0–36.0)
MCV: 87.9 fL (ref 80.0–100.0)
Platelets: 299 10*3/uL (ref 150–400)
RBC: 4.38 MIL/uL (ref 3.87–5.11)
RDW: 14.1 % (ref 11.5–15.5)
WBC: 14.4 10*3/uL — ABNORMAL HIGH (ref 4.0–10.5)
nRBC: 0 % (ref 0.0–0.2)

## 2022-10-04 MED ORDER — PROMETHAZINE HCL 25 MG PO TABS: 25.0000 mg | ORAL_TABLET | Freq: Four times a day (QID) | ORAL | 2 refills | Status: DC | PRN

## 2022-10-04 MED ORDER — ONDANSETRON 4 MG PO TBDP
4.0000 mg | ORAL_TABLET | Freq: Once | ORAL | Status: AC
  Administered 2022-10-04: 4 mg via ORAL
  Filled 2022-10-04: qty 1
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
## 2022-10-04 NOTE — MAU Note (Signed)
..  Julia Taylor is a 37 y.o. at Unknown here in MAU reporting: reports she began vomiting this afternoon and taste the saline when she vomits and it is clear. Has not vomited since she has gotten to MAU, last time she vomited was 6:30 pm. Has not eaten because she is worried she will vomit again. Has not taken any nausea medicine.  Denies pain.   Pain score: 0/10 Vitals:   10/04/22 1939  BP: 126/72  Pulse: 83  Resp: 16  Temp: 98.2 F (36.8 C)  SpO2: 100%     FHT:n/a Lab orders placed from triage:  UA

## 2022-11-14 ENCOUNTER — Ambulatory Visit (INDEPENDENT_AMBULATORY_CARE_PROVIDER_SITE_OTHER): Payer: 59 | Admitting: Family Medicine

## 2022-11-14 VITALS — BP 120/70 | HR 72 | Ht 61.0 in | Wt 210.1 lb

## 2022-11-14 DIAGNOSIS — K8020 Calculus of gallbladder without cholecystitis without obstruction: Secondary | ICD-10-CM

## 2022-11-14 NOTE — Progress Notes (Signed)
    SUBJECTIVE:   CHIEF COMPLAINT / HPI:   Gallstones - diagnosed on u/s in May 2024 Since then, has had multiple episodes of N/V after eating - especially after large meals. Was also pregnant during this time, had c-section in September, healing well from that Most recently had vomiting 11/9 after eating popeyes chicken. Tends to occur after heavier/greasy meals.  Occasionally has abdominal pain with these episodes Denies significant fevers, dysuria, constipation, diarrhea.  Currently denies abdominal pain Hoping to see a specialist to discuss surgical options   PERTINENT  PMH / PSH: gallstones  OBJECTIVE:   BP 120/70   Pulse 72   Ht 5\' 1"  (1.549 m)   Wt 210 lb 2 oz (95.3 kg)   LMP 11/13/2022   SpO2 99%   BMI 39.70 kg/m    General: NAD, pleasant, able to participate in exam Cardiac: RRR, no murmurs auscultated Respiratory: CTAB, normal WOB Abdomen: soft, non-tender, non-distended, normoactive bowel sounds. Neg murphy sign Extremities: warm and well perfused, no edema or cyanosis Skin: warm and dry, no rashes noted Neuro: alert, no obvious focal deficits, speech normal Psych: Normal affect and mood  RUQ Ultrasound 05/28/2022 shows cholelithiasis and gallbladder sludge without evidence of acute cholecystitis  ASSESSMENT/PLAN:   Assessment & Plan Symptomatic cholelithiasis Reassuringly afebrile with benign abdominal exam.  CMP 10/04/2022 unremarkable.  Her nausea/vomiting may have been related to her pregnancy though given patient preference and evidence of gallstones on RUQ ultrasound in 05/2022 would be worth evaluation for possible cholecystectomy.  Placed referral to general surgery and provided supportive care recommendations and return precautions   Vonna Drafts, MD [facility]

## 2022-11-14 NOTE — Patient Instructions (Signed)
I have placed a referral to general surgery -you should receive a call about this within the next few weeks

## 2022-11-28 ENCOUNTER — Encounter: Payer: Self-pay | Admitting: Family Medicine

## 2022-11-28 ENCOUNTER — Encounter: Payer: Self-pay | Admitting: Neurology

## 2022-11-28 ENCOUNTER — Ambulatory Visit (INDEPENDENT_AMBULATORY_CARE_PROVIDER_SITE_OTHER): Payer: 59 | Admitting: Family Medicine

## 2022-11-28 VITALS — BP 122/76 | HR 76 | Temp 98.1°F | Ht 61.0 in | Wt 210.4 lb

## 2022-11-28 DIAGNOSIS — K8020 Calculus of gallbladder without cholecystitis without obstruction: Secondary | ICD-10-CM

## 2022-11-28 DIAGNOSIS — Z7689 Persons encountering health services in other specified circumstances: Secondary | ICD-10-CM

## 2022-11-28 NOTE — Patient Instructions (Addendum)
-  It was a pleasure to meet you and look forward to taking care of you.  -Please contact [facility] to make an appointment.  [address], [place], [place] [address]  [phone] -If you have trouble with making an appointment, please call back to the this office or send a MyChart.  -Follow up in 6 months for a physical. -Happy Thanksgiving!
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
## 2022-12-12 ENCOUNTER — Other Ambulatory Visit: Payer: Self-pay | Admitting: Surgery

## 2022-12-15 ENCOUNTER — Other Ambulatory Visit: Payer: Self-pay

## 2022-12-15 NOTE — Progress Notes (Signed)
SDW CALL  Patient was given pre-op instructions over the phone. The opportunity was given for the patient to ask questions. No further questions asked. Patient verbalized understanding of instructions given.   PCP - Zandra Abts Cardiologist - denies  PPM/ICD - denies   Chest x-ray - denies EKG - denies Stress Test - denies ECHO - denies Cardiac Cath - denies  Sleep Study - negative for OSA    Blood Thinner Instructions: n/a Aspirin Instructions: n/a  ERAS Protcol - clears until 1100   COVID TEST- n/a   Anesthesia review: no  Patient denies shortness of breath, fever, cough and chest pain over the phone call   All instructions explained to the patient, with a verbal understanding of the material. Patient agrees to go over the instructions while at home for a better understanding.    Patient states she has not had a seizure since she was 7.

## 2022-12-17 NOTE — H&P (Signed)
REFERRING PHYSICIAN: Jettie Pagan* PROVIDER: Wayne Both, MD MRN: [id number] DOB: November 07, 1985 DATE OF ENCOUNTER: 12/12/2022 Subjective   Chief Complaint: New Consultation (cholelithiasis)  History of Present Illness: Julia Taylor is a 37 y.o. female who is seen  as an office consultation for evaluation of New Consultation (cholelithiasis)  This is a pleasant 36 year old female is referred here for evaluation of symptomatic gallstones. She developed symptoms from her gallstones during her pregnancy. She had an ultrasound in May of this year showing gallstones with no gallbladder wall thickening and a normal bile duct. She has since had a C-section on September 25 and is doing well from that. She is still been having occasional right upper quadrant abdominal pain with fatty meals but no further nausea or vomiting. She denies jaundice. Her pain is mild to moderate in intensity. She denies jaundice that is otherwise without complaints  Review of Systems: A complete review of systems was obtained from the patient. I have reviewed this information and discussed as appropriate with the patient. See HPI as well for other ROS.  ROS   Medical History: Past Medical History:  Diagnosis Date  Glaucoma (increased eye pressure)  Seizures (CMS/HHS-HCC)   There is no problem list on file for this patient.  Past Surgical History:  Procedure Laterality Date  brain surgery  CESAREAN SECTION    Allergies  Allergen Reactions  Phenobarbital Anaphylaxis and Other (See Comments)  Seizure  Carbamazepine Other (See Comments)  Seizures  Phenytoin Other (See Comments)  seizures   Current Outpatient Medications on File Prior to Visit  Medication Sig Dispense Refill  norethindrone (MICRONOR) 0.35 mg tablet Take 1 tablet by mouth once daily   No current facility-administered medications on file prior to visit.   Family History  Problem Relation Age of Onset  Colon cancer Mother   High blood pressure (Hypertension) Mother  Obesity Mother  Diabetes Mother  High blood pressure (Hypertension) Father  Obesity Father  Diabetes Father  Obesity Sister  Diabetes Sister    Social History   Tobacco Use  Smoking Status Never  Smokeless Tobacco Never    Social History   Socioeconomic History  Marital status: Single  Tobacco Use  Smoking status: Never  Smokeless tobacco: Never  Vaping Use  Vaping status: Never Used  Substance and Sexual Activity  Alcohol use: Not Currently  Drug use: Never   Social Drivers of Health   Food Insecurity: No Food Insecurity (09/26/2022)  Received from [facility]  Hunger Vital Sign  Worried About Running Out of Food in the Last Year: Never true  Ran Out of Food in the Last Year: Never true  Physical Activity: Insufficiently Active (12/05/2017)  Received from [facility]  Exercise Vital Sign  Days of Exercise per Week: 3 days  Minutes of Exercise per Session: 30 min  Received from [employer]  Social Network   Objective:   Vitals:  12/12/22 1358  BP: 124/72  Pulse: 83  Temp: 36.7 C (98 F)  SpO2: 98%  Weight: 96.3 kg (212 lb 6.4 oz)  Height: 154.9 cm (5\' 1" )  PainSc: 0-No pain   Body mass index is 40.13 kg/m.  Physical Exam   She appears well on exam  Abdomen is soft and nontender. There are no masses and no hepatomegaly  Labs, Imaging and Diagnostic Testing: I reviewed her ultrasound and her notes in the electronic medical records  Assessment and Plan:   Diagnoses and all orders for this visit:  Symptomatic cholelithiasis  I gave her literature regarding gallstones and gallbladder surgery. We discussed the reasons to proceed with surgery to avoid complications of gallstones. Given her ongoing symptoms I would recommend proceeding with a laparoscopic cholecystectomy. I explained the surgical procedure in detail. We discussed the risks which includes but is not limited to bleeding, infection, injury  to surrounding structures, bile duct injury, bile leak, the need to convert to an open procedure, cardiopulmonary issues, postoperative recovery, etc. She understands and wished to proceed with surgery which will be scheduled

## 2022-12-18 ENCOUNTER — Ambulatory Visit (HOSPITAL_COMMUNITY): Payer: 59 | Admitting: Anesthesiology

## 2022-12-18 ENCOUNTER — Ambulatory Visit: Payer: 59 | Admitting: Neurology

## 2022-12-18 ENCOUNTER — Ambulatory Visit (HOSPITAL_COMMUNITY)
Admission: RE | Admit: 2022-12-18 | Discharge: 2022-12-18 | Disposition: A | Discharge status: Home / Self Care | Payer: 59 | Attending: Surgery | Admitting: Surgery

## 2022-12-18 ENCOUNTER — Other Ambulatory Visit: Payer: Self-pay

## 2022-12-18 ENCOUNTER — Encounter (HOSPITAL_COMMUNITY)
Admission: RE | Disposition: A | Discharge status: Home / Self Care | Payer: Self-pay | Source: Home / Self Care | Attending: Surgery

## 2022-12-18 DIAGNOSIS — K8020 Calculus of gallbladder without cholecystitis without obstruction: Secondary | ICD-10-CM | POA: Diagnosis not present

## 2022-12-18 DIAGNOSIS — G35 Multiple sclerosis: Secondary | ICD-10-CM | POA: Diagnosis not present

## 2022-12-18 DIAGNOSIS — Z6841 Body Mass Index (BMI) 40.0 and over, adult: Secondary | ICD-10-CM | POA: Insufficient documentation

## 2022-12-18 DIAGNOSIS — K8010 Calculus of gallbladder with chronic cholecystitis without obstruction: Secondary | ICD-10-CM | POA: Insufficient documentation

## 2022-12-18 DIAGNOSIS — E66813 Obesity, class 3: Secondary | ICD-10-CM | POA: Insufficient documentation

## 2022-12-18 HISTORY — PX: CHOLECYSTECTOMY: SHX55

## 2022-12-18 LAB — CBC
HCT: 42.3 % (ref 36.0–46.0)
Hemoglobin: 13.9 g/dL (ref 12.0–15.0)
MCH: 28.3 pg (ref 26.0–34.0)
MCHC: 32.9 g/dL (ref 30.0–36.0)
MCV: 86.2 fL (ref 80.0–100.0)
Platelets: 322 10*3/uL (ref 150–400)
RBC: 4.91 MIL/uL (ref 3.87–5.11)
RDW: 13.9 % (ref 11.5–15.5)
WBC: 5.1 10*3/uL (ref 4.0–10.5)
nRBC: 0 % (ref 0.0–0.2)

## 2022-12-18 LAB — POCT PREGNANCY, URINE: Preg Test, Ur: NEGATIVE

## 2022-12-18 SURGERY — LAPAROSCOPIC CHOLECYSTECTOMY
Anesthesia: General | Site: Abdomen

## 2022-12-18 MED ORDER — ONDANSETRON HCL 4 MG/2ML IJ SOLN
INTRAMUSCULAR | Status: AC
  Filled 2022-12-18: qty 2

## 2022-12-18 MED ORDER — BUPIVACAINE-EPINEPHRINE (PF) 0.25% -1:200000 IJ SOLN
INTRAMUSCULAR | Status: AC
  Filled 2022-12-18: qty 30

## 2022-12-18 MED ORDER — OXYCODONE HCL 5 MG PO TABS: 5.0000 mg | ORAL_TABLET | Freq: Four times a day (QID) | ORAL | 0 refills | Status: DC | PRN

## 2022-12-18 MED ORDER — LACTATED RINGERS IV SOLN: INTRAVENOUS | Status: DC

## 2022-12-18 MED ORDER — 0.9 % SODIUM CHLORIDE (POUR BTL) OPTIME
TOPICAL | Status: DC | PRN
  Administered 2022-12-18: 1000 mL

## 2022-12-18 MED ORDER — ROCURONIUM BROMIDE 10 MG/ML (PF) SYRINGE
PREFILLED_SYRINGE | INTRAVENOUS | Status: AC
  Filled 2022-12-18: qty 10

## 2022-12-18 MED ORDER — EPHEDRINE 5 MG/ML INJ
INTRAVENOUS | Status: AC
  Filled 2022-12-18: qty 5

## 2022-12-18 MED ORDER — DEXAMETHASONE SODIUM PHOSPHATE 10 MG/ML IJ SOLN
INTRAMUSCULAR | Status: DC | PRN
  Administered 2022-12-18: 10 mg via INTRAVENOUS

## 2022-12-18 MED ORDER — PROPOFOL 10 MG/ML IV BOLUS
INTRAVENOUS | Status: DC | PRN
  Administered 2022-12-18: 150 mg via INTRAVENOUS

## 2022-12-18 MED ORDER — OXYCODONE HCL 5 MG PO TABS
5.0000 mg | ORAL_TABLET | Freq: Once | ORAL | Status: AC
  Administered 2022-12-18: 5 mg via ORAL

## 2022-12-18 MED ORDER — MIDAZOLAM HCL 2 MG/2ML IJ SOLN
INTRAMUSCULAR | Status: DC | PRN
  Administered 2022-12-18: 2 mg via INTRAVENOUS

## 2022-12-18 MED ORDER — FENTANYL CITRATE (PF) 100 MCG/2ML IJ SOLN
25.0000 ug | INTRAMUSCULAR | Status: DC | PRN
  Administered 2022-12-18: 50 ug via INTRAVENOUS

## 2022-12-18 MED ORDER — LIDOCAINE 2% (20 MG/ML) 5 ML SYRINGE
INTRAMUSCULAR | Status: DC | PRN
  Administered 2022-12-18: 60 mg via INTRAVENOUS

## 2022-12-18 MED ORDER — CHLORHEXIDINE GLUCONATE 0.12 % MT SOLN: 15.0000 mL | Freq: Once | OROMUCOSAL | Status: AC

## 2022-12-18 MED ORDER — ORAL CARE MOUTH RINSE: 15.0000 mL | Freq: Once | OROMUCOSAL | Status: DC

## 2022-12-18 MED ORDER — ENSURE PRE-SURGERY PO LIQD: 296.0000 mL | Freq: Once | ORAL | Status: DC

## 2022-12-18 MED ORDER — CHLORHEXIDINE GLUCONATE 0.12 % MT SOLN: 15.0000 mL | Freq: Once | OROMUCOSAL | Status: DC

## 2022-12-18 MED ORDER — SCOPOLAMINE 1 MG/3DAYS TD PT72
MEDICATED_PATCH | TRANSDERMAL | Status: AC
  Administered 2022-12-18: 1.5 mg via TRANSDERMAL
  Filled 2022-12-18: qty 1

## 2022-12-18 MED ORDER — ACETAMINOPHEN 160 MG/5ML PO SOLN: 960.0000 mg | Freq: Once | ORAL | Status: AC

## 2022-12-18 MED ORDER — PROPOFOL 10 MG/ML IV BOLUS
INTRAVENOUS | Status: AC
  Filled 2022-12-18: qty 20

## 2022-12-18 MED ORDER — FAMOTIDINE IN NACL 20-0.9 MG/50ML-% IV SOLN
INTRAVENOUS | Status: AC
  Filled 2022-12-18: qty 50

## 2022-12-18 MED ORDER — LIDOCAINE 2% (20 MG/ML) 5 ML SYRINGE
INTRAMUSCULAR | Status: AC
  Filled 2022-12-18: qty 5

## 2022-12-18 MED ORDER — OXYCODONE HCL 5 MG PO TABS
ORAL_TABLET | ORAL | Status: AC
  Filled 2022-12-18: qty 1

## 2022-12-18 MED ORDER — FENTANYL CITRATE (PF) 250 MCG/5ML IJ SOLN
INTRAMUSCULAR | Status: DC | PRN
  Administered 2022-12-18 (×2): 50 ug via INTRAVENOUS

## 2022-12-18 MED ORDER — SODIUM CHLORIDE 0.9 % IR SOLN
Status: DC | PRN
  Administered 2022-12-18: 1000 mL

## 2022-12-18 MED ORDER — FENTANYL CITRATE (PF) 250 MCG/5ML IJ SOLN
INTRAMUSCULAR | Status: AC
  Filled 2022-12-18: qty 5

## 2022-12-18 MED ORDER — DEXAMETHASONE SODIUM PHOSPHATE 10 MG/ML IJ SOLN
INTRAMUSCULAR | Status: AC
  Filled 2022-12-18: qty 1

## 2022-12-18 MED ORDER — ORAL CARE MOUTH RINSE: 15.0000 mL | Freq: Once | OROMUCOSAL | Status: AC

## 2022-12-18 MED ORDER — SUGAMMADEX SODIUM 200 MG/2ML IV SOLN
INTRAVENOUS | Status: DC | PRN
  Administered 2022-12-18: 200 mg via INTRAVENOUS

## 2022-12-18 MED ORDER — SCOPOLAMINE 1 MG/3DAYS TD PT72: 1.0000 | MEDICATED_PATCH | Freq: Once | TRANSDERMAL | Status: DC

## 2022-12-18 MED ORDER — SOD CITRATE-CITRIC ACID 500-334 MG/5ML PO SOLN: 30.0000 mL | Freq: Once | ORAL | Status: AC

## 2022-12-18 MED ORDER — ACETAMINOPHEN 500 MG PO TABS
ORAL_TABLET | ORAL | Status: AC
  Administered 2022-12-18: 1000 mg via ORAL
  Filled 2022-12-18: qty 2

## 2022-12-18 MED ORDER — ACETAMINOPHEN 500 MG PO TABS: 1000.0000 mg | ORAL_TABLET | ORAL | Status: DC

## 2022-12-18 MED ORDER — MIDAZOLAM HCL 2 MG/2ML IJ SOLN
INTRAMUSCULAR | Status: AC
  Filled 2022-12-18: qty 2

## 2022-12-18 MED ORDER — CHLORHEXIDINE GLUCONATE CLOTH 2 % EX PADS
6.0000 | MEDICATED_PAD | Freq: Once | CUTANEOUS | Status: AC
  Administered 2022-12-18: 6 via TOPICAL

## 2022-12-18 MED ORDER — SODIUM CHLORIDE 0.9 % IV SOLN: INTRAVENOUS | Status: DC | PRN

## 2022-12-18 MED ORDER — FENTANYL CITRATE (PF) 100 MCG/2ML IJ SOLN
INTRAMUSCULAR | Status: AC
  Administered 2022-12-18: 50 ug via INTRAVENOUS
  Filled 2022-12-18: qty 2

## 2022-12-18 MED ORDER — CHLORHEXIDINE GLUCONATE CLOTH 2 % EX PADS: 6.0000 | MEDICATED_PAD | Freq: Once | CUTANEOUS | Status: DC

## 2022-12-18 MED ORDER — ACETAMINOPHEN 500 MG PO TABS: 1000.0000 mg | ORAL_TABLET | Freq: Once | ORAL | Status: AC

## 2022-12-18 MED ORDER — ROCURONIUM BROMIDE 10 MG/ML (PF) SYRINGE
PREFILLED_SYRINGE | INTRAVENOUS | Status: DC | PRN
  Administered 2022-12-18: 50 mg via INTRAVENOUS

## 2022-12-18 MED ORDER — CHLORHEXIDINE GLUCONATE 0.12 % MT SOLN
OROMUCOSAL | Status: AC
  Administered 2022-12-18: 15 mL via OROMUCOSAL
  Filled 2022-12-18: qty 15

## 2022-12-18 MED ORDER — FAMOTIDINE 20 MG PO TABS
20.0000 mg | ORAL_TABLET | Freq: Once | ORAL | Status: AC
  Administered 2022-12-18: 20 mg via ORAL
  Filled 2022-12-18: qty 1

## 2022-12-18 MED ORDER — CEFAZOLIN SODIUM-DEXTROSE 2-4 GM/100ML-% IV SOLN
2.0000 g | INTRAVENOUS | Status: AC
  Administered 2022-12-18: 2 g via INTRAVENOUS

## 2022-12-18 MED ORDER — SOD CITRATE-CITRIC ACID 500-334 MG/5ML PO SOLN
ORAL | Status: AC
  Administered 2022-12-18: 30 mL via ORAL
  Filled 2022-12-18: qty 30

## 2022-12-18 MED ORDER — BUPIVACAINE-EPINEPHRINE 0.25% -1:200000 IJ SOLN
INTRAMUSCULAR | Status: DC | PRN
  Administered 2022-12-18: 20 mL

## 2022-12-18 MED ORDER — ONDANSETRON HCL 4 MG/2ML IJ SOLN
INTRAMUSCULAR | Status: DC | PRN
  Administered 2022-12-18: 4 mg via INTRAVENOUS

## 2022-12-18 MED ORDER — CEFAZOLIN SODIUM-DEXTROSE 2-4 GM/100ML-% IV SOLN
INTRAVENOUS | Status: AC
  Filled 2022-12-18: qty 100

## 2022-12-18 MED ORDER — PHENYLEPHRINE 80 MCG/ML (10ML) SYRINGE FOR IV PUSH (FOR BLOOD PRESSURE SUPPORT)
PREFILLED_SYRINGE | INTRAVENOUS | Status: AC
  Filled 2022-12-18: qty 10

## 2022-12-18 SURGICAL SUPPLY — 32 items
APPLIER CLIP 5 13 M/L LIGAMAX5 (MISCELLANEOUS) ×1
BAG COUNTER SPONGE SURGICOUNT (BAG) ×1 IMPLANT
CANISTER SUCT 3000ML PPV (MISCELLANEOUS) ×1 IMPLANT
CHLORAPREP W/TINT 26 (MISCELLANEOUS) ×1 IMPLANT
CLIP APPLIE 5 13 M/L LIGAMAX5 (MISCELLANEOUS) ×1 IMPLANT
COVER SURGICAL LIGHT HANDLE (MISCELLANEOUS) ×1 IMPLANT
DERMABOND ADVANCED .7 DNX12 (GAUZE/BANDAGES/DRESSINGS) ×1 IMPLANT
ELECT REM PT RETURN 9FT ADLT (ELECTROSURGICAL) ×1
ELECTRODE REM PT RTRN 9FT ADLT (ELECTROSURGICAL) ×1 IMPLANT
GLOVE SURG SIGNA 7.5 PF LTX (GLOVE) ×1 IMPLANT
GOWN STRL REUS W/ TWL LRG LVL3 (GOWN DISPOSABLE) ×2 IMPLANT
GOWN STRL REUS W/ TWL XL LVL3 (GOWN DISPOSABLE) ×1 IMPLANT
IRRIG SUCT STRYKERFLOW 2 WTIP (MISCELLANEOUS) ×1
IRRIGATION SUCT STRKRFLW 2 WTP (MISCELLANEOUS) ×1 IMPLANT
KIT BASIN OR (CUSTOM PROCEDURE TRAY) ×1 IMPLANT
KIT TURNOVER KIT B (KITS) ×1 IMPLANT
NS IRRIG 1000ML POUR BTL (IV SOLUTION) ×1 IMPLANT
PAD ARMBOARD 7.5X6 YLW CONV (MISCELLANEOUS) ×1 IMPLANT
SCISSORS LAP 5X35 DISP (ENDOMECHANICALS) ×1 IMPLANT
SET TUBE SMOKE EVAC HIGH FLOW (TUBING) ×1 IMPLANT
SLEEVE Z-THREAD 5X100MM (TROCAR) ×2 IMPLANT
SPECIMEN JAR SMALL (MISCELLANEOUS) ×1 IMPLANT
SUT MNCRL AB 4-0 PS2 18 (SUTURE) ×1 IMPLANT
SYS BAG RETRIEVAL 10MM (BASKET) ×1
SYSTEM BAG RETRIEVAL 10MM (BASKET) ×1 IMPLANT
TOWEL GREEN STERILE (TOWEL DISPOSABLE) ×1 IMPLANT
TOWEL GREEN STERILE FF (TOWEL DISPOSABLE) ×1 IMPLANT
TRAY LAPAROSCOPIC MC (CUSTOM PROCEDURE TRAY) ×1 IMPLANT
TROCAR BALLN 12MMX100 BLUNT (TROCAR) ×1 IMPLANT
TROCAR Z-THREAD OPTICAL 5X100M (TROCAR) ×1 IMPLANT
WARMER LAPAROSCOPE (MISCELLANEOUS) ×1 IMPLANT
WATER STERILE IRR 1000ML POUR (IV SOLUTION) ×1 IMPLANT

## 2022-12-18 NOTE — Discharge Instructions (Signed)
[facility] ______[facility] LAPAROSCOPIC SURGERY: POST OP INSTRUCTIONS Always review your discharge instruction sheet given to you by the facility where your surgery was performed. IF YOU HAVE DISABILITY OR FAMILY LEAVE FORMS, YOU MUST BRING THEM TO THE OFFICE FOR PROCESSING.   DO NOT GIVE THEM TO YOUR DOCTOR.  A prescription for pain medication may be given to you upon discharge.  Take your pain medication as prescribed, if needed.  If narcotic pain medicine is not needed, then you may take acetaminophen (Tylenol) or ibuprofen (Advil) as needed. Take your usually prescribed medications unless otherwise directed. If you need a refill on your pain medication, please contact your pharmacy.  They will contact our office to request authorization. Prescriptions will not be filled after 5pm or on week-ends. You should follow a light diet the first few days after arrival home, such as soup and crackers, etc.  Be sure to include lots of fluids daily. Most patients will experience some swelling and bruising in the area of the incisions.  Ice packs will help.  Swelling and bruising can take several days to resolve.  It is common to experience some constipation if taking pain medication after surgery.  Increasing fluid intake and taking a stool softener (such as Colace) will usually help or prevent this problem from occurring.  A mild laxative (Milk of Magnesia or Miralax) should be taken according to package instructions if there are no bowel movements after 48 hours. Unless discharge instructions indicate otherwise, you may remove your bandages 24-48 hours after surgery, and you may shower at that time.  You may have steri-strips (small skin tapes) in place directly over the incision.  These strips should be left on the skin for 7-10 days.  If your surgeon used skin glue on the incision, you may shower in 24 hours.  The glue will flake off over the next 2-3 weeks.  Any sutures or staples will be  removed at the office during your follow-up visit. ACTIVITIES:  You may resume regular (light) daily activities beginning the next day--such as daily self-care, walking, climbing stairs--gradually increasing activities as tolerated.  You may have sexual intercourse when it is comfortable.  Refrain from any heavy lifting or straining until approved by your doctor. You may drive when you are no longer taking prescription pain medication, you can comfortably wear a seatbelt, and you can safely maneuver your car and apply brakes. RETURN TO WORK:  __________________________________________________________ You should see your doctor in the office for a follow-up appointment approximately 2-3 weeks after your surgery.  Make sure that you call for this appointment within a day or two after you arrive home to insure a convenient appointment time. OTHER INSTRUCTIONS: YOU MAY SHOWER STARTING TOMORROW ICE PACK, TYLENOL, AND IBUPROFEN ALSO FOR PAIN NO LIFTING MORE THAN 15 TO 20 POUNDS FOR 2 WEEKS __________________________________________________________________________________________________________________________ __________________________________________________________________________________________________________________________ WHEN TO CALL YOUR DOCTOR: Fever over 101.0 Inability to urinate Continued bleeding from incision. Increased pain, redness, or drainage from the incision. Increasing abdominal pain  The clinic staff is available to answer your questions during regular business hours.  Please don't hesitate to call and ask to speak to one of the nurses for clinical concerns.  If you have a medical emergency, go to the nearest emergency room or call 911.  A surgeon from [facility] is always on call at the hospital. [address], [address], [place], [place]  [address] ? [address] [address], [place], [place]    [address] [phone] ?   [phone] ? FAX [phone] Web site:  [email or web address] 

## 2022-12-18 NOTE — Transfer of Care (Signed)
Immediate Anesthesia Transfer of Care Note  Patient: Julia Taylor  Procedure(s) Performed: LAPAROSCOPIC CHOLECYSTECTOMY (Abdomen)  Patient Location: PACU  Anesthesia Type:General  Level of Consciousness: awake, alert , and oriented  Airway & Oxygen Therapy: Patient Spontanous Breathing and Patient connected to face mask oxygen  Post-op Assessment: Report given to RN and Post -op Vital signs reviewed and stable  Post vital signs: Reviewed and stable  Last Vitals:  Vitals Value Taken Time  BP 122/58 12/18/22 1520  Temp 97.4   Pulse 85 12/18/22 1522  Resp 14 12/18/22 1522  SpO2 100 % 12/18/22 1522  Vitals shown include unfiled device data.  Last Pain:  Vitals:   12/18/22 1218  TempSrc:   PainSc: 0-No pain         Complications: No notable events documented.

## 2022-12-18 NOTE — Op Note (Signed)
Laparoscopic Cholecystectomy Procedure Note  Indications: This patient presents with symptomatic gallbladder disease and will undergo laparoscopic cholecystectomy.  Pre-operative Diagnosis: symptomatic cholelithiasis  Post-operative Diagnosis: same  Surgeon: Abigail Miyamoto   Assistants: non  Anesthesia: General endotracheal anesthesia  ASA Class: 2  Procedure Details  The patient was seen again in the Holding Room. The risks, benefits, complications, treatment options, and expected outcomes were discussed with the patient. The possibilities of reaction to medication, pulmonary aspiration, perforation of viscus, bleeding, recurrent infection, finding a normal gallbladder, the need for additional procedures, failure to diagnose a condition, the possible need to convert to an open procedure, and creating a complication requiring transfusion or operation were discussed with the patient. The likelihood of improving the patient's symptoms with return to their baseline status is good.  The patient and/or family concurred with the proposed plan, giving informed consent. The site of surgery properly noted. The patient was taken to Operating Room, identified as Julia Taylor and the procedure verified as Laparoscopic Cholecystectomy with Intraoperative Cholangiogram. A Time Out was held and the above information confirmed.  Prior to the induction of general anesthesia, antibiotic prophylaxis was administered. General endotracheal anesthesia was then administered and tolerated well. After the induction, the abdomen was prepped with Chloraprep and draped in sterile fashion. The patient was positioned in the supine position.  Local anesthetic agent was injected into the skin near the umbilicus and an incision made. We dissected down to the abdominal fascia with blunt dissection.  The fascia was incised vertically and we entered the peritoneal cavity bluntly.  A pursestring suture of 0-Vicryl was placed  around the fascial opening.  The Hasson cannula was inserted and secured with the stay suture.  Pneumoperitoneum was then created with CO2 and tolerated well without any adverse changes in the patient's vital signs. An 5-mm port was placed in the subxiphoid position.  Two 5-mm ports were placed in the right upper quadrant. All skin incisions were infiltrated with a local anesthetic agent before making the incision and placing the trocars.   We positioned the patient in reverse Trendelenburg, tilted slightly to the patient's left.  The gallbladder was identified, the fundus grasped and retracted cephalad. Adhesions were lysed bluntly and with the electrocautery where indicated, taking care not to injure any adjacent organs or viscus. The infundibulum was grasped and retracted laterally, exposing the peritoneum overlying the triangle of Calot. This was then divided and exposed in a blunt fashion. The cystic duct was clearly identified and bluntly dissected circumferentially. A critical view of the cystic duct and cystic artery was obtained.  The cystic duct was then ligated with clips and divided. The cystic artery was, dissected free, ligated with clips and divided as well.   The gallbladder was dissected from the liver bed in retrograde fashion with the electrocautery. The gallbladder was removed and placed in an Endocatch sac. The liver bed was irrigated and inspected. Hemostasis was achieved with the electrocautery.  The gallbladder and Endocatch sac were then removed through the umbilical port site.  The pursestring suture was used to close the umbilical fascia.    We again inspected the right upper quadrant for hemostasis.  Pneumoperitoneum was released as we removed the trocars.  4-0 Monocryl was used to close the skin.   Skin glue was then applied. The patient was then extubated and brought to the recovery room in stable condition. Instrument, sponge, and needle counts were correct at closure and at the  conclusion of the case.  Findings: Chronic cholecystitis with Cholelithiasis  Estimated Blood Loss: Minimal         Drains: none         Specimens: Gallbladder           Complications: None; patient tolerated the procedure well.         Disposition: PACU - hemodynamically stable.         Condition: stable

## 2022-12-18 NOTE — Anesthesia Preprocedure Evaluation (Addendum)
Anesthesia Evaluation  Patient identified by MRN, date of birth, ID band Patient awake    Reviewed: Allergy & Precautions, NPO status , Patient's Chart, lab work & pertinent test results  Airway Mallampati: II  TM Distance: >3 FB Neck ROM: Full    Dental no notable dental hx. (+) Teeth Intact, Dental Advisory Given   Pulmonary neg pulmonary ROS   Pulmonary exam normal breath sounds clear to auscultation       Cardiovascular negative cardio ROS Normal cardiovascular exam Rhythm:Regular Rate:Normal     Neuro/Psych  Headaches, Seizures - (not on anti-epileptics), Well Controlled,  MS, no symptoms, no meds  negative psych ROS   GI/Hepatic negative GI ROS, Neg liver ROS,,,  Endo/Other    Class 3 obesity (BMI 40)  Renal/GU negative Renal ROS  negative genitourinary   Musculoskeletal negative musculoskeletal ROS (+)    Abdominal   Peds  Hematology negative hematology ROS (+)   Anesthesia Other Findings   Reproductive/Obstetrics                             Anesthesia Physical Anesthesia Plan  ASA: 3  Anesthesia Plan: General   Post-op Pain Management: Tylenol PO (pre-op)*   Induction: Intravenous  PONV Risk Score and Plan: 3 and Midazolam, Dexamethasone and Ondansetron  Airway Management Planned: Oral ETT  Additional Equipment:   Intra-op Plan:   Post-operative Plan: Extubation in OR  Informed Consent: I have reviewed the patients History and Physical, chart, labs and discussed the procedure including the risks, benefits and alternatives for the proposed anesthesia with the patient or authorized representative who has indicated his/her understanding and acceptance.     Dental advisory given  Plan Discussed with: CRNA  Anesthesia Plan Comments:        Anesthesia Quick Evaluation

## 2022-12-18 NOTE — Anesthesia Procedure Notes (Signed)
Procedure Name: Intubation Date/Time: 12/18/2022 2:50 PM  Performed by: Pincus Large, CRNAPre-anesthesia Checklist: Patient identified, Emergency Drugs available, Suction available and Patient being monitored Patient Re-evaluated:Patient Re-evaluated prior to induction Oxygen Delivery Method: Circle System Utilized Preoxygenation: Pre-oxygenation with 100% oxygen Induction Type: IV induction Ventilation: Mask ventilation without difficulty Laryngoscope Size: Miller and 2 Grade View: Grade I Tube type: Oral Tube size: 7.0 mm Number of attempts: 1 Airway Equipment and Method: Stylet and Oral airway Placement Confirmation: ETT inserted through vocal cords under direct vision, positive ETCO2 and breath sounds checked- equal and bilateral Secured at: 22 cm Tube secured with: Tape Dental Injury: Teeth and Oropharynx as per pre-operative assessment

## 2022-12-18 NOTE — Interval H&P Note (Signed)
History and Physical Interval Note:no change in H and P  12/18/2022 1:30 PM  Julia Taylor  has presented today for surgery, with the diagnosis of Symptomatic Gallstones.  The various methods of treatment have been discussed with the patient and family. After consideration of risks, benefits and other options for treatment, the patient has consented to  Procedure(s): LAPAROSCOPIC CHOLECYSTECTOMY (N/A) as a surgical intervention.  The patient's history has been reviewed, patient examined, no change in status, stable for surgery.  I have reviewed the patient's chart and labs.  Questions were answered to the patient's satisfaction.     Abigail Miyamoto

## 2022-12-19 ENCOUNTER — Encounter (HOSPITAL_COMMUNITY): Payer: Self-pay | Admitting: Surgery

## 2022-12-19 LAB — SURGICAL PATHOLOGY

## 2022-12-19 NOTE — Anesthesia Postprocedure Evaluation (Signed)
Anesthesia Post Note  Patient: Julia Taylor  Procedure(s) Performed: LAPAROSCOPIC CHOLECYSTECTOMY (Abdomen)     Patient location during evaluation: PACU Anesthesia Type: General Level of consciousness: awake and alert Pain management: pain level controlled Vital Signs Assessment: post-procedure vital signs reviewed and stable Respiratory status: spontaneous breathing, nonlabored ventilation and respiratory function stable Cardiovascular status: blood pressure returned to baseline and stable Postop Assessment: no apparent nausea or vomiting Anesthetic complications: no   No notable events documented.  Last Vitals:  Vitals:   12/18/22 1545 12/18/22 1600  BP: 119/76 108/83  Pulse: 69 68  Resp: 16 15  Temp:  36.7 C  SpO2: 95% 100%    Last Pain:  Vitals:   12/18/22 1600  TempSrc:   PainSc: 4                  Ayeshia Coppin,W. EDMOND

## 2023-01-25 ENCOUNTER — Telehealth: Payer: Self-pay | Admitting: Neurology

## 2023-01-25 NOTE — Telephone Encounter (Signed)
Julia Taylor with [name] asking if a PA has been submitted and if so what is the status. Can be submitted via Cover My Meds, Key: [id number]. Requesting call back with information [phone]

## 2023-01-25 NOTE — Telephone Encounter (Signed)
Dr. Epimenio Foot- did you want to try and work her in sooner?

## 2023-01-25 NOTE — Telephone Encounter (Signed)
Pt was pregnant/breastfeeding. Cx recent appt d/t surgery. Has upcoming appt 03/2023 to discuss going back on MS medication.

## 2023-01-25 NOTE — Telephone Encounter (Signed)
LVM for pt to call office.  Phone room: If she calls, please let her know Dr. Epimenio Foot wanted [place] to try and work her in sooner for appt to discuss MS med.   Please offer 02/05/23 at 3pm with Dr. Epimenio Foot. Appt on hold

## 2023-01-29 NOTE — Telephone Encounter (Signed)
Called pt. Scheduled sooner appt for 02/05/23 at 9am with Dr. Epimenio Foot. Asked she check in at 830am. Cx appt in March 2025 since she is coming in sooner.

## 2023-01-31 NOTE — Telephone Encounter (Signed)
Julia Taylor From [employer] called stating that the P.A. will be expiring on 02-08-23. [name] was informed that the pt is breastfeeding but is scheduled to be seen in Feb and getting back on the [name] will be revisited at that time. If pt decides to get back on it then a P.A. request will have to be submitted again.

## 2023-01-31 NOTE — Telephone Encounter (Signed)
Noted

## 2023-02-05 ENCOUNTER — Telehealth: Payer: Self-pay

## 2023-02-05 ENCOUNTER — Ambulatory Visit: Payer: 59 | Admitting: Neurology

## 2023-02-05 ENCOUNTER — Encounter: Payer: Self-pay | Admitting: Neurology

## 2023-02-05 VITALS — BP 142/68 | HR 83 | Ht 61.0 in | Wt 213.0 lb

## 2023-02-05 DIAGNOSIS — G4733 Obstructive sleep apnea (adult) (pediatric): Secondary | ICD-10-CM

## 2023-02-05 DIAGNOSIS — R5383 Other fatigue: Secondary | ICD-10-CM | POA: Diagnosis not present

## 2023-02-05 DIAGNOSIS — G35 Multiple sclerosis: Secondary | ICD-10-CM

## 2023-02-05 DIAGNOSIS — Z79899 Other long term (current) drug therapy: Secondary | ICD-10-CM

## 2023-02-05 DIAGNOSIS — R269 Unspecified abnormalities of gait and mobility: Secondary | ICD-10-CM
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
## 2023-02-05 NOTE — Telephone Encounter (Signed)
Faxed Zeposia Start Form as well as Insurance Card to [phone] on 02/05/2023

## 2023-02-06 ENCOUNTER — Encounter: Payer: Self-pay | Admitting: Neurology

## 2023-02-06 LAB — COMPREHENSIVE METABOLIC PANEL
ALT: 20 [IU]/L (ref 0–32)
AST: 17 [IU]/L (ref 0–40)
Albumin: 3.9 g/dL (ref 3.9–4.9)
Alkaline Phosphatase: 94 [IU]/L (ref 44–121)
BUN/Creatinine Ratio: 15 (ref 9–23)
BUN: 9 mg/dL (ref 6–20)
Bilirubin Total: 0.2 mg/dL (ref 0.0–1.2)
CO2: 22 mmol/L (ref 20–29)
Calcium: 9.8 mg/dL (ref 8.7–10.2)
Chloride: 106 mmol/L (ref 96–106)
Creatinine, Ser: 0.62 mg/dL (ref 0.57–1.00)
Globulin, Total: 2.6 g/dL (ref 1.5–4.5)
Glucose: 90 mg/dL (ref 70–99)
Potassium: 3.7 mmol/L (ref 3.5–5.2)
Sodium: 139 mmol/L (ref 134–144)
Total Protein: 6.5 g/dL (ref 6.0–8.5)
eGFR: 119 mL/min/{1.73_m2} (ref >59)

## 2023-02-06 LAB — CBC WITH DIFFERENTIAL/PLATELET
Basophils Absolute: 0 10*3/uL (ref 0.0–0.2)
Basos: 0 %
EOS (ABSOLUTE): 0.2 10*3/uL (ref 0.0–0.4)
Eos: 3 %
Hematocrit: 42.4 % (ref 34.0–46.6)
Hemoglobin: 13.9 g/dL (ref 11.1–15.9)
Immature Grans (Abs): 0 10*3/uL (ref 0.0–0.1)
Immature Granulocytes: 0 %
Lymphocytes Absolute: 1.1 10*3/uL (ref 0.7–3.1)
Lymphs: 18 %
MCH: 28.3 pg (ref 26.6–33.0)
MCHC: 32.8 g/dL (ref 31.5–35.7)
MCV: 86 fL (ref 79–97)
Monocytes Absolute: 0.4 10*3/uL (ref 0.1–0.9)
Monocytes: 6 %
Neutrophils Absolute: 4.2 10*3/uL (ref 1.4–7.0)
Neutrophils: 73 %
Platelets: 290 10*3/uL (ref 150–450)
RBC: 4.91 x10E6/uL (ref 3.77–5.28)
RDW: 14.2 % (ref 11.7–15.4)
WBC: 5.8 10*3/uL (ref 3.4–10.8)

## 2023-02-08 NOTE — Telephone Encounter (Signed)
 Julia Taylor from [employer] called and stated that the Zeposia  form that was submitted for the pt is not updated and the 3.0 Version is needed. This form can be found on the Portal using Key# [id number] or it can be downloaded and faxed to [phone]. Their hours are Mon-Fri EST from 8am-8pm  [phone] can be called with any f/u questions.

## 2023-02-12 NOTE — Telephone Encounter (Signed)
 PA for Zeposia  was approved by [facility].

## 2023-02-12 NOTE — Telephone Encounter (Signed)
 LVM for pt to call back to get her email address to send her Zeposia  Start form to her email address.

## 2023-02-12 NOTE — Telephone Encounter (Signed)
 Completed PA for  Zeposia  0.92 mg capsules via CMM. Sent to [facility]. Key: [id number]. Should have a determination within 1-3 business days.

## 2023-02-13 MED ORDER — ZEPOSIA 7-DAY STARTER PACK 4 X 0.23MG & 3 X 0.46MG PO CPPK: ORAL_CAPSULE | ORAL | 0 refills | Status: DC

## 2023-02-13 MED ORDER — ZEPOSIA 0.92 MG PO CAPS: 0.9200 mg | ORAL_CAPSULE | Freq: Every day | ORAL | 1 refills | Status: DC

## 2023-02-13 NOTE — Addendum Note (Signed)
Addended by: Geronimo Running A on: 02/13/2023 03:03 PM   Modules accepted: Orders

## 2023-02-14 MED ORDER — ZEPOSIA 7-DAY STARTER PACK 4 X 0.23MG & 3 X 0.46MG PO CPPK: ORAL_CAPSULE | ORAL | 0 refills | Status: DC

## 2023-02-14 MED ORDER — ZEPOSIA 0.92 MG PO CAPS: 0.9200 mg | ORAL_CAPSULE | Freq: Every day | ORAL | 1 refills | Status: DC

## 2023-02-14 NOTE — Addendum Note (Signed)
Addended by: Arther Abbott on: 02/14/2023 10:09 AM   Modules accepted: Orders

## 2023-02-19 NOTE — Telephone Encounter (Signed)
Julia Taylor has called to inquire about the enrollment application, an updated form is needed, previous is outdated. A call back # is [phone] if wanting to submit online thru Cover My Meds code [id number]

## 2023-02-20 NOTE — Telephone Encounter (Signed)
We received enrollment form unfortunately on a out dated form. Would need updated form submitted. Would like a call back to walk you through it. [phone]

## 2023-02-20 NOTE — Telephone Encounter (Signed)
Phone room: please see below messages if [employer]ep[employer]ia 360 support calls again. We are aware pt signed outdated forms. Please let them know we are actively working on this. Waiting on pt to return her portion of start form at the moment for the updated version. We will then fax in once we receive.

## 2023-02-22 ENCOUNTER — Telehealth: Payer: Self-pay | Admitting: Neurology

## 2023-02-22 NOTE — Telephone Encounter (Signed)
PA for [employer] Kit approved by [employer].

## 2023-02-22 NOTE — Telephone Encounter (Signed)
PA for Zeposia starter kit has been completed and sent to [employer] via CMM. Key: [id number]. Should have a determination within 1-3 days.

## 2023-02-22 NOTE — Telephone Encounter (Signed)
I called [facility]. I advised them that PA for [facility] Kit & maintenance dose was approved. This was a 15 minute phone call. Ti will take them an additional 1-2 days to process this information.

## 2023-02-22 NOTE — Telephone Encounter (Signed)
Jennifer from [facility] has called reporting that a PA is needed for [employer] pack

## 2023-02-22 NOTE — Telephone Encounter (Signed)
[employer], can you help w/ this? Thank you!

## 2023-02-23 ENCOUNTER — Other Ambulatory Visit (HOSPITAL_COMMUNITY): Payer: Self-pay

## 2023-02-26 NOTE — Telephone Encounter (Signed)
 Faxed below form back to [facility]. Received fax confirmation.

## 2023-02-26 NOTE — Telephone Encounter (Signed)
 Called pt back. She will send back updated start form today or tomorrow for [place] to send in.

## 2023-02-28 NOTE — Telephone Encounter (Signed)
 Faxed upated copy of Zeposia start form below to [employer]ep[employer] 360 support, received fax confirmation.

## 2023-03-01 NOTE — Telephone Encounter (Signed)
 Received fax from [employer] 360 support that they did not get insurance card faxed yesterday with form. It was cut off. Asked we resend. I re-faxed to them at [phone]. Received fax confirmation.

## 2023-03-07 ENCOUNTER — Telehealth: Payer: Self-pay | Admitting: Neurology

## 2023-03-07 NOTE — Telephone Encounter (Signed)
 Tresa Endo at [employer] is asking if RN will call pt and provide her with the # to the Hub for Cover My Meds [phone] and connect with her case manager by the name of Jomarie Longs

## 2023-03-08 NOTE — Telephone Encounter (Signed)
 Called [employer]ep[employer]ia 360 support/Gabby. They are trying to reach pt to do welcome call. Tried calling pt 03/07/23. They are scheduled to try and call her again 03/09/23.   They also need to do conference call w/ [employer] and pt to do benefit verification. However, they do see PA approval for starter and maintenance dose.   I tried doing conference call for pt but had to LVM for pt to call [employer] back to do welcome call.

## 2023-03-12 ENCOUNTER — Ambulatory Visit: Payer: 59 | Admitting: Neurology

## 2023-03-12 NOTE — Telephone Encounter (Signed)
 Took call from phone room and spoke w/ Joe/[employer] 360 support. They tried calling pt again today, unable to reach. I tried to do another conference call but went to VM. I LVM for pt asking she call them back at [phone].

## 2023-03-13 NOTE — Telephone Encounter (Signed)
I called patient again to discuss. No answer, left a message asking her to call us back. 

## 2023-03-19 ENCOUNTER — Other Ambulatory Visit: Payer: Self-pay | Admitting: Neurology

## 2023-03-27 NOTE — Telephone Encounter (Signed)
 Letter mailed to address on file.

## 2023-03-27 NOTE — Telephone Encounter (Signed)
 I called patient again to discuss.  No answer, left a message asking her to call us back.  We have attempted to call patient multiple times and she has not responded to either our calls nor our MyChart messages to her.  I will send her a letter asking her to call us back regarding her Zeposia.  It is unclear if she has received the Zeposia and is tolerating it well.

## 2023-04-24 ENCOUNTER — Encounter: Payer: Self-pay | Admitting: Family Medicine

## 2023-04-24 NOTE — Telephone Encounter (Signed)
 Appt made for 04/27/23 at 9

## 2023-04-27 ENCOUNTER — Ambulatory Visit: Admitting: Family Medicine

## 2023-04-27 ENCOUNTER — Encounter: Payer: Self-pay | Admitting: Family Medicine

## 2023-04-27 VITALS — BP 134/78 | HR 70 | Temp 98.3°F | Wt 218.0 lb

## 2023-04-27 DIAGNOSIS — R109 Unspecified abdominal pain: Secondary | ICD-10-CM | POA: Diagnosis not present

## 2023-04-27 DIAGNOSIS — R112 Nausea with vomiting, unspecified: Secondary | ICD-10-CM | POA: Diagnosis not present

## 2023-04-27 LAB — COMPREHENSIVE METABOLIC PANEL WITH GFR
ALT: 19 U/L (ref 0–35)
AST: 16 U/L (ref 0–37)
Albumin: 3.8 g/dL (ref 3.5–5.2)
Alkaline Phosphatase: 68 U/L (ref 39–117)
BUN: 9 mg/dL (ref 6–23)
CO2: 24 meq/L (ref 19–32)
Calcium: 10.1 mg/dL (ref 8.4–10.5)
Chloride: 108 meq/L (ref 96–112)
Creatinine, Ser: 0.64 mg/dL (ref 0.40–1.20)
GFR: 114.37 mL/min (ref >60.00)
Glucose, Bld: 86 mg/dL (ref 70–99)
Potassium: 3.9 meq/L (ref 3.5–5.1)
Sodium: 138 meq/L (ref 135–145)
Total Bilirubin: 0.3 mg/dL (ref 0.2–1.2)
Total Protein: 6.9 g/dL (ref 6.0–8.3)

## 2023-04-27 LAB — CBC WITH DIFFERENTIAL/PLATELET
Basophils Absolute: 0 10*3/uL (ref 0.0–0.1)
Basophils Relative: 0.1 % (ref 0.0–3.0)
Eosinophils Absolute: 0.2 10*3/uL (ref 0.0–0.7)
Eosinophils Relative: 3.4 % (ref 0.0–5.0)
HCT: 42.1 % (ref 36.0–46.0)
Hemoglobin: 14.1 g/dL (ref 12.0–15.0)
Lymphocytes Relative: 23 % (ref 12.0–46.0)
Lymphs Abs: 1.3 10*3/uL (ref 0.7–4.0)
MCHC: 33.4 g/dL (ref 30.0–36.0)
MCV: 88.5 fl (ref 78.0–100.0)
Monocytes Absolute: 0.4 10*3/uL (ref 0.1–1.0)
Monocytes Relative: 7.7 % (ref 3.0–12.0)
Neutro Abs: 3.7 10*3/uL (ref 1.4–7.7)
Neutrophils Relative %: 65.8 % (ref 43.0–77.0)
Platelets: 255 10*3/uL (ref 150.0–400.0)
RBC: 4.76 Mil/uL (ref 3.87–5.11)
RDW: 13.9 % (ref 11.5–15.5)
WBC: 5.7 10*3/uL (ref 4.0–10.5)

## 2023-04-27 LAB — LIPASE: Lipase: 31 U/L (ref 11.0–59.0)

## 2023-04-27 MED ORDER — ONDANSETRON 4 MG PO TBDP: 4.0000 mg | ORAL_TABLET | Freq: Three times a day (TID) | ORAL | 0 refills | Status: AC | PRN

## 2023-04-27 MED ORDER — DICYCLOMINE HCL 20 MG PO TABS: 20.0000 mg | ORAL_TABLET | Freq: Three times a day (TID) | ORAL | 0 refills | Status: DC

## 2023-04-27 NOTE — Patient Instructions (Signed)
-  Ordered labs. Office will call with lab results and you will see them on MyChart.  -Ordered CT abd pelvis without contrast scan for abd pain. Office will call with scheduling appointment within the next week. -Prescribed Zofran  4mg  tablet, place 1 tablet underneath tongue every 8 hours as needed for nausea and vomiting. -Prescribed Dicyclomine 20mg  tablet, take 1 tablet three times a day before meals and at bedtime. If medication is beneficial with pain , please send a MyChart message back and will refill medication.

## 2023-04-27 NOTE — Progress Notes (Signed)
 Established Patient Office Visit   Subjective:  Patient ID: Julia Taylor, female    DOB: [birth year]  Age: 38 y.o. MRN: [id number]  Chief Complaint  Patient presents with   Abdominal Pain   Emesis    HPI: Patient is complaining of abd pain. During the day she has generalized abd pain in the day and at night she has mid, pinpoint pain. She had a laparoscopic cholecystomy on 12/16. Since surgery has experienced pain. Intermittent pain, occurs 2-3 times a week. She reports she notices the pain more after she eats. Usually last 30 minutes. Associated symptoms: nausea vomiting. She mentions her vomitus is yellow. Denies fever, diarrhea, constipation, blood in stools, or dark, tarry stools. She reports nothing makes her pain better and the pain solves on it owns. Can not identify anything that makes it worse. She denies taking any medication for these symptoms.   Review of Systems  Gastrointestinal:  Positive for abdominal pain and vomiting.  See HPI above     Objective:   BP 134/78   Pulse 70   Temp 98.3 F (36.8 C) (Oral)   Wt 218 lb (98.9 kg)   LMP  (LMP Unknown)   SpO2 98%   BMI 41.19 kg/m    Physical Exam Vitals reviewed.  Constitutional:      General: She is not in acute distress.    Appearance: Normal appearance. She is obese. She is not ill-appearing, toxic-appearing or diaphoretic.  HENT:     Head: Normocephalic and atraumatic.  Eyes:     General:        Right eye: No discharge.        Left eye: No discharge.     Conjunctiva/sclera: Conjunctivae normal.  Cardiovascular:     Rate and Rhythm: Normal rate and regular rhythm.     Heart sounds: Normal heart sounds. No murmur heard.    No friction rub. No gallop.  Pulmonary:     Effort: Pulmonary effort is normal. No respiratory distress.     Breath sounds: Normal breath sounds.  Abdominal:     General: Bowel sounds are decreased. There is no distension.     Palpations: Abdomen is soft. There is no mass.      Tenderness: There is no abdominal tenderness.  Musculoskeletal:        General: Normal range of motion.  Skin:    General: Skin is warm and dry.  Neurological:     General: No focal deficit present.     Mental Status: She is alert and oriented to person, place, and time. Mental status is at baseline.  Psychiatric:        Mood and Affect: Mood normal.        Behavior: Behavior normal.        Thought Content: Thought content normal.        Judgment: Judgment normal.      Assessment & Plan:  Abdominal pain, unspecified abdominal location -     CBC with Differential/Platelet -     Comprehensive metabolic panel with GFR -     Lipase -     CT ABDOMEN PELVIS WO CONTRAST; Future -     Dicyclomine HCl; Take 1 tablet (20 mg total) by mouth 4 (four) times daily -  before meals and at bedtime for 7 days.  Dispense: 28 tablet; Refill: 0  Nausea and vomiting, unspecified vomiting type -     Ondansetron ; Take 1 tablet (4 mg total) by mouth every  8 (eight) hours as needed for up to 7 days for nausea or vomiting.  Dispense: 21 tablet; Refill: 0  -Ordered labs (CBC, CMP, & Lipase) for abd pain and nausea and vomiting.  -Ordered CT abd pelvis without contrast scan for abd pain. Office will call with scheduling appointment within the next week. -Prescribed Zofran  4mg  ODT every 8 hours as needed for nausea and vomiting.  -Prescribed Dicyclomine 20mg  tablet, take 1 tablet three times a day before meals and at bedtime. If medication is beneficial with pain, advised to please send a MyChart message back and will refill medication.  Aleila Syverson, NP

## 2023-05-02 ENCOUNTER — Ambulatory Visit
Admission: RE | Admit: 2023-05-02 | Discharge: 2023-05-02 | Disposition: A | Discharge status: Home / Self Care | Source: Ambulatory Visit | Attending: Family Medicine

## 2023-05-02 DIAGNOSIS — R109 Unspecified abdominal pain: Secondary | ICD-10-CM

## 2023-05-03 ENCOUNTER — Encounter: Payer: Self-pay | Admitting: Family Medicine

## 2023-05-03 DIAGNOSIS — R112 Nausea with vomiting, unspecified: Secondary | ICD-10-CM

## 2023-05-03 DIAGNOSIS — R109 Unspecified abdominal pain: Secondary | ICD-10-CM

## 2023-05-29 ENCOUNTER — Encounter: Payer: 59 | Admitting: Family Medicine

## 2023-06-13 ENCOUNTER — Encounter: Payer: Self-pay | Admitting: Family Medicine

## 2023-06-30 ENCOUNTER — Other Ambulatory Visit: Payer: Self-pay

## 2023-06-30 ENCOUNTER — Telehealth: Admitting: Nurse Practitioner

## 2023-06-30 ENCOUNTER — Encounter (HOSPITAL_BASED_OUTPATIENT_CLINIC_OR_DEPARTMENT_OTHER): Payer: Self-pay

## 2023-06-30 DIAGNOSIS — S0511XA Contusion of eyeball and orbital tissues, right eye, initial encounter: Secondary | ICD-10-CM | POA: Diagnosis not present

## 2023-06-30 DIAGNOSIS — H1131 Conjunctival hemorrhage, right eye: Secondary | ICD-10-CM | POA: Insufficient documentation

## 2023-06-30 DIAGNOSIS — H5711 Ocular pain, right eye: Secondary | ICD-10-CM | POA: Diagnosis present

## 2023-06-30 DIAGNOSIS — Q159 Congenital malformation of eye, unspecified: Secondary | ICD-10-CM | POA: Diagnosis not present
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
## 2023-06-30 NOTE — Patient Instructions (Addendum)
  Yanai Milissa, thank you for joining Haze LELON Servant, NP for today's virtual visit.  While this provider is not your primary care provider (PCP), if your PCP is located in our provider database this encounter information will be shared with them immediately following your visit.   A [facility] MyChart account gives you access to today's visit and all your visits, tests, and labs performed at [facility]  click here if you don't have a [facility] MyChart account or go to [email or web address]  Consent: (Patient) Mia Milissa provided verbal consent for this virtual visit at the beginning of the encounter.  Current Medications:  Current Outpatient Medications:    dicyclomine  (BENTYL ) 20 MG tablet, Take 1 tablet (20 mg total) by mouth 4 (four) times daily -  before meals and at bedtime for 7 days., Disp: 28 tablet, Rfl: 0   latanoprost (XALATAN) 0.005 % ophthalmic solution, Place 1 drop into both eyes at bedtime., Disp: , Rfl:    medroxyPROGESTERone (DEPO-PROVERA) 150 MG/ML injection, Inject 150 mg into the muscle every 3 (three) months., Disp: , Rfl:    ZEPOSIA  0.92 MG CAPS, Take 1 capsule (0.92 mg total) by mouth daily., Disp: 90 capsule, Rfl: 1   ZEPOSIA  7-DAY STARTER PACK 4 x 0.23MG  & 3 x 0.46MG  CPPK, Follow starter kit directions. To be completed before beginning maintenance dose., Disp: 1 each, Rfl: 0   Medications ordered in this encounter:  No orders of the defined types were placed in this encounter.    *If you need refills on other medications prior to your next appointment, please contact your pharmacy*  Follow-Up: Call back or seek an in-person evaluation if the symptoms worsen or if the condition fails to improve as anticipated.  [facility] [phone]  Other Instructions Apply warm compresses to eye to help with blood flow and healing.   If you have been instructed to have an in-person evaluation today at a local Urgent Care facility,  please use the link below. It will take you to a list of all of our available [facility], including address, phone number and hours of operation. Please do not delay care.  [facility]  If you or a family member do not have a primary care provider, use the link below to schedule a visit and establish care. When you choose a [facility] primary care physician or advanced practice provider, you gain a long-term partner in health. Find a Primary Care Provider  Learn more about [facility]'s in-office and virtual care options: [facility] - Get Care Now

## 2023-06-30 NOTE — ED Triage Notes (Signed)
 Pt complaining of a swollen and dark right eye that started Monday. It got swollen and painful 2 days ago. Now the skin around the eye has turned dark.

## 2023-07-01 ENCOUNTER — Emergency Department (HOSPITAL_BASED_OUTPATIENT_CLINIC_OR_DEPARTMENT_OTHER)
Admission: EM | Admit: 2023-07-01 | Discharge: 2023-07-01 | Disposition: A | Discharge status: Home / Self Care | Attending: Emergency Medicine | Admitting: Emergency Medicine

## 2023-07-01 DIAGNOSIS — H1131 Conjunctival hemorrhage, right eye: Secondary | ICD-10-CM

## 2023-07-01 MED ORDER — TOBRAMYCIN 0.3 % OP SOLN
2.0000 [drp] | Freq: Once | OPHTHALMIC | Status: AC
  Administered 2023-07-01: 2 [drp] via OPHTHALMIC
  Filled 2023-07-01: qty 5

## 2023-07-01 NOTE — Discharge Instructions (Signed)
 Use the Tobrex eyedrops, 2 drops 4 times daily for the next several days.  Apply warm compresses as frequently as possible for the next several days.  Follow-up with primary doctor if not improving in the next few days.

## 2023-07-01 NOTE — ED Provider Notes (Signed)
   EMERGENCY DEPARTMENT AT [facility] Provider Note   CSN: [id number] Arrival date & time: 06/30/23  1951     Patient presents with: Eye Pain   Julia Taylor is a 38 y.o. female.   Patient is a 38 year old female with history of multiple sclerosis.  Patient presenting today with right eye deviation and swelling, worsening over the past 2 days.  She denies any injury or trauma.  She denies any visual disturbances.       Prior to Admission medications   Medication Sig Start Date End Date Taking? Authorizing Provider  dicyclomine  (BENTYL ) 20 MG tablet Take 1 tablet (20 mg total) by mouth 4 (four) times daily -  before meals and at bedtime for 7 days. 04/27/23 05/04/23  Williamson, Joanna R, NP  latanoprost (XALATAN) 0.005 % ophthalmic solution Place 1 drop into both eyes at bedtime.    [provider]  medroxyPROGESTERone (DEPO-PROVERA) 150 MG/ML injection Inject 150 mg into the muscle every 3 (three) months.    [provider]  ZEPOSIA  0.92 MG CAPS Take 1 capsule (0.92 mg total) by mouth daily. 02/14/23   Sater, Charlie LABOR, MD  ZEPOSIA  7-DAY STARTER PACK 4 x 0.23MG  & 3 x 0.46MG  CPPK Follow starter kit directions. To be completed before beginning maintenance dose. 02/14/23   Sater, Charlie LABOR, MD    Allergies: Dilantin [phenytoin], Phenobarbital, and Tegretol [carbamazepine]    Review of Systems  All other systems reviewed and are negative.   Updated Vital Signs BP (!) 148/98   Pulse 85   Temp 98.4 F (36.9 C)   Resp 16   Ht 5' 1 (1.549 m)   Wt 97.5 kg   SpO2 100%   BMI 40.62 kg/m   Physical Exam Vitals and nursing note reviewed.  Constitutional:      Appearance: Normal appearance.   Eyes:     Comments: There is a subtle conjunctival hemorrhage noted laterally.  Cornea is clear and remainder of eye exam is unremarkable.  Pulmonary:     Effort: Pulmonary effort is normal.   Neurological:     Mental Status: She is alert and oriented  to person, place, and time.     (all labs ordered are listed, but only abnormal results are displayed) Labs Reviewed - No data to display  EKG: None  Radiology: No results found.   Procedures   Medications Ordered in the ED  tobramycin (TOBREX) 0.3 % ophthalmic solution 2 drop (has no administration in time range)                                    Medical Decision Making Risk Prescription drug management.   Patient presenting with complaints of swelling of her eyelid and eye irritation as described in the HPI.  On exam, she has a small subconjunctival hemorrhage and some swelling to the upper eyelid just to be treated as both a subconjunctival hemorrhage and stye.  Patient to follow-up as needed.     Final diagnoses:  None    ED Discharge Orders     None          Geroldine Berg, MD 07/01/23 [phone]
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
## 2023-09-18 ENCOUNTER — Encounter: Payer: Self-pay | Admitting: Gastroenterology

## 2023-09-18 ENCOUNTER — Other Ambulatory Visit (INDEPENDENT_AMBULATORY_CARE_PROVIDER_SITE_OTHER)

## 2023-09-18 ENCOUNTER — Ambulatory Visit: Admitting: Gastroenterology

## 2023-09-18 ENCOUNTER — Ambulatory Visit: Payer: Self-pay | Admitting: Gastroenterology

## 2023-09-18 VITALS — BP 110/70 | HR 72 | Ht 60.0 in | Wt 216.0 lb

## 2023-09-18 DIAGNOSIS — R630 Anorexia: Secondary | ICD-10-CM

## 2023-09-18 DIAGNOSIS — R112 Nausea with vomiting, unspecified: Secondary | ICD-10-CM | POA: Diagnosis not present

## 2023-09-18 DIAGNOSIS — Z9049 Acquired absence of other specified parts of digestive tract: Secondary | ICD-10-CM

## 2023-09-18 DIAGNOSIS — R1033 Periumbilical pain: Secondary | ICD-10-CM | POA: Diagnosis not present

## 2023-09-18 LAB — CBC WITH DIFFERENTIAL/PLATELET
Basophils Absolute: 0 K/uL (ref 0.0–0.1)
Basophils Relative: 0.2 % (ref 0.0–3.0)
Eosinophils Absolute: 0.1 K/uL (ref 0.0–0.7)
Eosinophils Relative: 1.7 % (ref 0.0–5.0)
HCT: 42.7 % (ref 36.0–46.0)
Hemoglobin: 14.3 g/dL (ref 12.0–15.0)
Lymphocytes Relative: 23.3 % (ref 12.0–46.0)
Lymphs Abs: 1.1 K/uL (ref 0.7–4.0)
MCHC: 33.5 g/dL (ref 30.0–36.0)
MCV: 87.8 fl (ref 78.0–100.0)
Monocytes Absolute: 0.4 K/uL (ref 0.1–1.0)
Monocytes Relative: 8.2 % (ref 3.0–12.0)
Neutro Abs: 3.1 K/uL (ref 1.4–7.7)
Neutrophils Relative %: 66.6 % (ref 43.0–77.0)
Platelets: 251 K/uL (ref 150.0–400.0)
RBC: 4.86 Mil/uL (ref 3.87–5.11)
RDW: 13.5 % (ref 11.5–15.5)
WBC: 4.6 K/uL (ref 4.0–10.5)

## 2023-09-18 LAB — COMPREHENSIVE METABOLIC PANEL WITH GFR
ALT: 14 U/L (ref 0–35)
AST: 13 U/L (ref 0–37)
Albumin: 4.1 g/dL (ref 3.5–5.2)
Alkaline Phosphatase: 60 U/L (ref 39–117)
BUN: 11 mg/dL (ref 6–23)
CO2: 21 meq/L (ref 19–32)
Calcium: 10.3 mg/dL (ref 8.4–10.5)
Chloride: 109 meq/L (ref 96–112)
Creatinine, Ser: 0.71 mg/dL (ref 0.40–1.20)
GFR: 109.73 mL/min (ref >60.00)
Glucose, Bld: 78 mg/dL (ref 70–99)
Potassium: 4 meq/L (ref 3.5–5.1)
Sodium: 139 meq/L (ref 135–145)
Total Bilirubin: 0.3 mg/dL (ref 0.2–1.2)
Total Protein: 7.4 g/dL (ref 6.0–8.3)

## 2023-09-18 LAB — TSH: TSH: 1.42 u[IU]/mL (ref 0.35–5.50)

## 2023-09-18 NOTE — Patient Instructions (Addendum)
 You have been scheduled for an endoscopy. Please follow written instructions given to you at your visit today.  If you use inhalers (even only as needed), please bring them with you on the day of your procedure.  If you take any of the following medications, they will need to be adjusted prior to your procedure:   DO NOT TAKE 7 DAYS PRIOR TO TEST- Trulicity (dulaglutide) Ozempic, Wegovy (semaglutide) Mounjaro (tirzepatide) Bydureon Bcise (exanatide extended release)  DO NOT TAKE 1 DAY PRIOR TO YOUR TEST Rybelsus (semaglutide) Adlyxin (lixisenatide) Victoza (liraglutide) Byetta (exanatide) ___________________________________________________________________________  Your provider has requested that you go to the basement level for lab work before leaving today. Press B on the elevator. The lab is located at the first door on the left as you exit the elevator.   _______________________________________________________  If your blood pressure at your visit was 140/90 or greater, please contact your primary care physician to follow up on this.  _______________________________________________________  If you are age 85 or older, your body mass index should be between 23-30. Your Body mass index is 42.18 kg/m. If this is out of the aforementioned range listed, please consider follow up with your Primary Care Provider.  If you are age 18 or younger, your body mass index should be between 19-25. Your Body mass index is 42.18 kg/m. If this is out of the aformentioned range listed, please consider follow up with your Primary Care Provider.   ________________________________________________________  The [facility] providers would like to encourage you to use MYCHART to communicate with providers for non-urgent requests or questions.  Due to long hold times on the telephone, sending your provider a message by [facility] may be a faster and more efficient way to get a response.  Please allow 48  business hours for a response.  Please remember that this is for non-urgent requests.  _______________________________________________________  [facility] is using a team-based approach to care.  Your team is made up of your doctor and two to three APPS. Our APPS (Nurse Practitioners and Physician Assistants) work with your physician to ensure care continuity for you. They are fully qualified to address your health concerns and develop a treatment plan. They communicate directly with your gastroenterologist to care for you. Seeing the Advanced Practice Practitioners on your physician's team can help you by facilitating care more promptly, often allowing for earlier appointments, access to diagnostic testing, procedures, and other specialty referrals.

## 2023-09-18 NOTE — Progress Notes (Signed)
 ____________________________________________________________  Attending physician addendum:  Thank you for sending this case to me. I have reviewed the entire note and agree with the plan.   Victory Brand, MD  ____________________________________________________________

## 2023-09-19 LAB — TISSUE TRANSGLUTAMINASE, IGA: (tTG) Ab, IgA: <1 U/mL

## 2023-09-19 LAB — IGA: Immunoglobulin A: 214 mg/dL (ref 47–310)

## 2023-10-26 ENCOUNTER — Encounter: Payer: Self-pay | Admitting: Gastroenterology

## 2023-11-02 ENCOUNTER — Encounter: Admitting: Gastroenterology

## 2023-11-02 ENCOUNTER — Telehealth: Payer: Self-pay | Admitting: Gastroenterology

## 2023-11-02 NOTE — Telephone Encounter (Signed)
 Good Morning Dr. Legrand   This patient came in today with a care partner that could not stay.  She rescheduled.   Please advise regarding NO SHOW

## 2023-11-02 NOTE — Telephone Encounter (Signed)
 New instructions sent to pt via MyChart. Sent pt a MyChart message to make her aware and to remind her that she has to have a care partner with her for the procedure.

## 2023-11-09 ENCOUNTER — Ambulatory Visit: Admitting: Gastroenterology

## 2023-11-09 ENCOUNTER — Encounter: Payer: Self-pay | Admitting: Gastroenterology

## 2023-11-09 VITALS — BP 107/64 | HR 80 | Temp 98.0°F | Resp 18 | Ht 60.0 in | Wt 216.0 lb

## 2023-11-09 DIAGNOSIS — R112 Nausea with vomiting, unspecified: Secondary | ICD-10-CM | POA: Diagnosis present

## 2023-11-09 DIAGNOSIS — R1033 Periumbilical pain: Secondary | ICD-10-CM

## 2023-11-09 DIAGNOSIS — K2970 Gastritis, unspecified, without bleeding: Secondary | ICD-10-CM

## 2023-11-09 DIAGNOSIS — K2980 Duodenitis without bleeding: Secondary | ICD-10-CM | POA: Diagnosis not present

## 2023-11-09 MED ORDER — SODIUM CHLORIDE 0.9 % IV SOLN: 500.0000 mL | INTRAVENOUS | Status: DC

## 2023-11-09 NOTE — Patient Instructions (Signed)

## 2023-11-09 NOTE — Op Note (Signed)
 [facility] Patient Name: Julia Taylor Procedure Date: 11/09/2023 4:24 PM MRN: [id number] Endoscopist: Victory L. Legrand , MD, [id number] Age: 38 Referring MD:  Date of Birth: 1985/03/28 Gender: Female Account #: [id number] Procedure:                Upper GI endoscopy Indications:              Nausea with vomiting                           Clinical details in recent office note episodes                            begin with epigastric to periumbilical abdominal                            pain                           Has persisted after cholecystectomy last year.                           No obstructive findings on CTAP earlier this year                           No clear food or other triggers for episodes. No                            associated hives or allergic symptoms with episodes Medicines:                Monitored Anesthesia Care Procedure:                Pre-Anesthesia Assessment:                           - Prior to the procedure, a History and Physical                            was performed, and patient medications and                            allergies were reviewed. The patient's tolerance of                            previous anesthesia was also reviewed. The risks                            and benefits of the procedure and the sedation                            options and risks were discussed with the patient.                            All questions were answered, and informed consent  was obtained. Prior Anticoagulants: The patient has                            taken no anticoagulant or antiplatelet agents. ASA                            Grade Assessment: III - A patient with severe                            systemic disease. After reviewing the risks and                            benefits, the patient was deemed in satisfactory                            condition to undergo the procedure.                            After obtaining informed consent, the endoscope was                            passed under direct vision. Throughout the                            procedure, the patient's blood pressure, pulse, and                            oxygen saturations were monitored continuously. The                            Olympus Scope [id number] was introduced through the                            mouth, and advanced to the second part of duodenum.                            The upper GI endoscopy was accomplished without                            difficulty. The patient tolerated the procedure                            well. Scope In: Scope Out: Findings:                 The esophagus was normal.                           Patchy mildly erythematous mucosa was found in the                            gastric antrum. Several biopsies were obtained in                            the gastric body and in  the gastric antrum with                            cold forceps for histology.                           The exam of the stomach was otherwise normal.                           The cardia and gastric fundus were normal on                            retroflexion.                           Normal mucosa was found in the entire duodenum.                            Biopsies for histology were taken with a cold                            forceps for evaluation of celiac disease. Complications:            No immediate complications. Estimated Blood Loss:     Estimated blood loss was minimal. Impression:               - Normal esophagus.                           - Erythematous mucosa in the antrum.                           - Normal mucosa was found in the entire examined                            duodenum. Biopsied.                           - Several biopsies were obtained in the gastric                            body and in the gastric antrum. Recommendation:           - Patient has a contact number  available for                            emergencies. The signs and symptoms of potential                            delayed complications were discussed with the                            patient. Return to normal activities tomorrow.                            Written discharge instructions were provided to the  patient.                           - Resume previous diet.                           - Continue present medications.                           - Await pathology results.                           - If all biopsies normal, schedule gastric emptying                            study.                           However, episodic nature of the symptoms speaks                            somewhat against gastroparesis and favors something                            more akin to cyclic vomiting syndrome. Jahmel Flannagan L. Legrand, MD 11/09/2023 4:54:26 PM This report has been signed electronically.

## 2023-11-09 NOTE — Progress Notes (Signed)
 Sedate, gd SR, tolerated procedure well, VSS, report to RN

## 2023-11-09 NOTE — Progress Notes (Signed)
1553 Robinul 0.1 mg IV given due large amount of secretions upon assessment.  MD made aware, vss  °
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
## 2023-11-12 ENCOUNTER — Telehealth: Payer: Self-pay | Admitting: *Deleted

## 2023-11-12 NOTE — Telephone Encounter (Signed)
  Follow up Call-     11/09/2023    3:06 PM  Call back number  Post procedure Call Back phone  # [phone]  Permission to leave phone message Yes     Patient questions:  Do you have a fever, pain , or abdominal swelling? No. Pain Score  0 *  Have you tolerated food without any problems? Yes.    Have you been able to return to your normal activities? Yes.    Do you have any questions about your discharge instructions: Diet   No. Medications  No. Follow up visit  No.  Do you have questions or concerns about your Care? No.  Actions: * If pain score is 4 or above: No action needed, pain <4.

## 2023-11-14 LAB — SURGICAL PATHOLOGY

## 2023-11-17 ENCOUNTER — Ambulatory Visit: Payer: Self-pay | Admitting: Gastroenterology

## 2023-11-17 DIAGNOSIS — R112 Nausea with vomiting, unspecified: Secondary | ICD-10-CM

## 2023-11-17 DIAGNOSIS — R1033 Periumbilical pain: Secondary | ICD-10-CM

## 2023-11-28 ENCOUNTER — Ambulatory Visit: Admitting: Gastroenterology

## 2024-01-29 ENCOUNTER — Telehealth: Payer: Self-pay | Admitting: Neurology

## 2024-01-29 NOTE — Telephone Encounter (Signed)
 Caroline from [employer] called stating that Pt need PA for Medication   CoverMyMed Key # [id number]    Callback [phone]

## 2024-01-30 ENCOUNTER — Telehealth: Payer: Self-pay | Admitting: Pharmacy Technician

## 2024-01-30 ENCOUNTER — Other Ambulatory Visit (HOSPITAL_COMMUNITY): Payer: Self-pay

## 2024-01-30 NOTE — Telephone Encounter (Signed)
 PA has been submitted, and telephone encounter has been created. Please see telephone encounter dated 1.28.26.

## 2024-01-30 NOTE — Telephone Encounter (Signed)
 Pharmacy Patient Advocate Encounter   Received notification from Pt Calls Messages that prior authorization for ZEP[employer]  0.92MG  is required/requested.   Insurance verification completed.   The patient is insured through [facility].   Per test claim: PA required; PA submitted to above mentioned insurance via Latent Key/confirmation #/EOC [id number] Status is pending

## 2024-02-04 ENCOUNTER — Other Ambulatory Visit (HOSPITAL_COMMUNITY): Payer: Self-pay
